# Patient Record
Sex: Female | Born: 1985 | Race: White | Hispanic: No | Marital: Married | Smoking: Current every day smoker
Health system: Southern US, Community
[De-identification: ages and names within clinical notes are randomized; demographics above are authoritative.]

## PROBLEM LIST (undated history)

## (undated) ENCOUNTER — Inpatient Hospital Stay (HOSPITAL_COMMUNITY): Payer: Self-pay

## (undated) DIAGNOSIS — N809 Endometriosis, unspecified: Secondary | ICD-10-CM

## (undated) DIAGNOSIS — A6000 Herpesviral infection of urogenital system, unspecified: Secondary | ICD-10-CM

## (undated) DIAGNOSIS — I10 Essential (primary) hypertension: Secondary | ICD-10-CM

## (undated) HISTORY — PX: ABDOMINAL HYSTERECTOMY: SHX81

## (undated) HISTORY — DX: Herpesviral infection of urogenital system, unspecified: A60.00

---

## 2007-05-29 HISTORY — PX: KNEE SURGERY: SHX244

## 2009-05-28 DIAGNOSIS — N809 Endometriosis, unspecified: Secondary | ICD-10-CM

## 2009-05-28 HISTORY — PX: OTHER SURGICAL HISTORY: SHX169

## 2009-05-28 HISTORY — DX: Endometriosis, unspecified: N80.9

## 2010-10-16 ENCOUNTER — Inpatient Hospital Stay (INDEPENDENT_AMBULATORY_CARE_PROVIDER_SITE_OTHER)
Admission: RE | Admit: 2010-10-16 | Discharge: 2010-10-16 | Disposition: A | Discharge status: Home / Self Care | Payer: Medicaid Other | Source: Ambulatory Visit | Attending: Family Medicine | Admitting: Family Medicine

## 2010-10-16 DIAGNOSIS — J029 Acute pharyngitis, unspecified: Secondary | ICD-10-CM

## 2013-01-02 ENCOUNTER — Inpatient Hospital Stay (HOSPITAL_COMMUNITY): Payer: Medicaid Other

## 2013-01-02 ENCOUNTER — Inpatient Hospital Stay (HOSPITAL_COMMUNITY)
Admission: AD | Admit: 2013-01-02 | Discharge: 2013-01-02 | Disposition: A | Discharge status: Home / Self Care | Payer: Medicaid Other | Source: Ambulatory Visit | Attending: Obstetrics and Gynecology | Admitting: Obstetrics and Gynecology

## 2013-01-02 ENCOUNTER — Encounter (HOSPITAL_COMMUNITY): Payer: Self-pay

## 2013-01-02 DIAGNOSIS — O209 Hemorrhage in early pregnancy, unspecified: Secondary | ICD-10-CM

## 2013-01-02 DIAGNOSIS — R109 Unspecified abdominal pain: Secondary | ICD-10-CM | POA: Insufficient documentation

## 2013-01-02 HISTORY — DX: Endometriosis, unspecified: N80.9

## 2013-01-02 LAB — CBC
MCH: 30.4 pg (ref 26.0–34.0)
MCHC: 34.2 g/dL (ref 30.0–36.0)
MCV: 89.1 fL (ref 78.0–100.0)
Platelets: 259 10*3/uL (ref 150–400)

## 2013-01-02 LAB — URINALYSIS, ROUTINE W REFLEX MICROSCOPIC
Ketones, ur: NEGATIVE mg/dL
Leukocytes, UA: NEGATIVE
Nitrite: NEGATIVE
Protein, ur: NEGATIVE mg/dL

## 2013-01-02 LAB — WET PREP, GENITAL: Trich, Wet Prep: NONE SEEN

## 2013-01-02 LAB — ABO/RH: ABO/RH(D): A POS

## 2013-01-02 LAB — POCT PREGNANCY, URINE: Preg Test, Ur: POSITIVE — AB

## 2013-01-02 NOTE — MAU Note (Signed)
Pt presents with complaints of vaginal spotting since lunchtime today and states that she is having abdominal cramping.

## 2013-01-02 NOTE — MAU Provider Note (Signed)
History     CSN: [id number]  Arrival date and time: 01/02/13 1542   First Provider Initiated Contact with Patient 01/02/13 1837      Chief Complaint  Patient presents with  . Abdominal Cramping   HPI  Pt is a G3P2002 at 4.0 wks IUP by LMP here with report of spotting of blood and pelvic pain x two days.  Pain is described as midpelvic and "crampy" in nature.  Patient's last menstrual period was 12/05/2012.  No report of abnormal vaginal discharge or UTI symptoms.     Past Medical History  Diagnosis Date  . Endometriosis 2011    Dx by lap    Past Surgical History  Procedure Laterality Date  . Knee surgery  2009  . Laproscopic exam  2011    No family history on file.  History  Substance Use Topics  . Smoking status: Not on file  . Smokeless tobacco: Not on file  . Alcohol Use: Not on file    Allergies:  Allergies  Allergen Reactions  . Cefzil (Cefprozil) Anaphylaxis and Swelling    Childhood reaction.  . Codeine Nausea And Vomiting    No prescriptions prior to admission    Review of Systems  Gastrointestinal: Positive for abdominal pain (cramping).  Genitourinary:       Spotting of blood  All other systems reviewed and are negative.   Physical Exam   Blood pressure 133/70, pulse 86, temperature 98.2 F (36.8 C), temperature source Oral, resp. rate 16, last menstrual period 12/05/2012.  Physical Exam  Constitutional: She is oriented to person, place, and time. She appears well-developed and well-nourished. No distress.  HENT:  Head: Normocephalic.  Neck: Normal range of motion. Neck supple.  Cardiovascular: Normal rate, regular rhythm and normal heart sounds.   Respiratory: Effort normal and breath sounds normal. No respiratory distress.  GI: Soft. She exhibits no mass. There is tenderness (midpelvic). There is no rebound and no guarding.  Genitourinary: Uterus is enlarged. Right adnexum displays no mass, no tenderness and no fullness. Left adnexum  displays no mass, no tenderness and no fullness. No bleeding around the vagina.  Musculoskeletal: Normal range of motion.  Neurological: She is alert and oriented to person, place, and time.  Skin: Skin is warm and dry.    MAU Course  Procedures  Results for orders placed during the hospital encounter of 01/02/13 (from the past 24 hour(s))  URINALYSIS, ROUTINE W REFLEX MICROSCOPIC     Status: None   Collection Time    01/02/13  4:35 PM      Result Value Range   Color, Urine YELLOW  YELLOW   APPearance CLEAR  CLEAR   Specific Gravity, Urine 1.010  1.005 - 1.030   pH 7.5  5.0 - 8.0   Glucose, UA NEGATIVE  NEGATIVE mg/dL   Hgb urine dipstick NEGATIVE  NEGATIVE   Bilirubin Urine NEGATIVE  NEGATIVE   Ketones, ur NEGATIVE  NEGATIVE mg/dL   Protein, ur NEGATIVE  NEGATIVE mg/dL   Urobilinogen, UA 0.2  0.0 - 1.0 mg/dL   Nitrite NEGATIVE  NEGATIVE   Leukocytes, UA NEGATIVE  NEGATIVE  POCT PREGNANCY, URINE     Status: Abnormal   Collection Time    01/02/13  4:50 PM      Result Value Range   Preg Test, Ur POSITIVE (*) NEGATIVE  HCG, QUANTITATIVE, PREGNANCY     Status: Abnormal   Collection Time    01/02/13  5:25 PM  Result Value Range   hCG, Beta Chain, Quant, S 97 (*) <5 mIU/mL  CBC     Status: None   Collection Time    01/02/13  5:25 PM      Result Value Range   WBC 5.2  4.0 - 10.5 K/uL   RBC 4.04  3.87 - 5.11 MIL/uL   Hemoglobin 12.3  12.0 - 15.0 g/dL   HCT 27.2  53.6 - 64.4 %   MCV 89.1  78.0 - 100.0 fL   MCH 30.4  26.0 - 34.0 pg   MCHC 34.2  30.0 - 36.0 g/dL   RDW 03.4  74.2 - 59.5 %   Platelets 259  150 - 400 K/uL  ABO/RH     Status: None   Collection Time    01/02/13  5:25 PM      Result Value Range   ABO/RH(D) A POS    WET PREP, GENITAL     Status: Abnormal   Collection Time    01/02/13  6:47 PM      Result Value Range   Yeast Wet Prep HPF POC NONE SEEN  NONE SEEN   Trich, Wet Prep NONE SEEN  NONE SEEN   Clue Cells Wet Prep HPF POC NONE SEEN  NONE SEEN    WBC, Wet Prep HPF POC FEW (*) NONE SEEN   Ultrasound: IMPRESSION:  No intrauterine gestational sac, yolk sac, fetal pole, or cardiac  activity visualized. Differential considerations include  intrauterine gestation too early to be sonographically visualized,  spontaneous abortion, or ectopic pregnancy. Consider follow-up  ultrasound in 14 days and serial quantitative beta HCG follow-up.   Assessment and Plan  Bleeding in Pregnancy  Plan: Discharge to home Repeat BHCG in 48 hrs Ectopic precautions  [facility] 01/02/2013, 6:41 PM

## 2013-01-04 ENCOUNTER — Encounter (HOSPITAL_COMMUNITY): Payer: Self-pay | Admitting: Obstetrics and Gynecology

## 2013-01-04 ENCOUNTER — Inpatient Hospital Stay (HOSPITAL_COMMUNITY)
Admission: AD | Admit: 2013-01-04 | Discharge: 2013-01-04 | Disposition: A | Discharge status: Home / Self Care | Payer: Medicaid Other | Source: Ambulatory Visit | Attending: Obstetrics and Gynecology | Admitting: Obstetrics and Gynecology

## 2013-01-04 DIAGNOSIS — O26859 Spotting complicating pregnancy, unspecified trimester: Secondary | ICD-10-CM | POA: Insufficient documentation

## 2013-01-04 DIAGNOSIS — Z3490 Encounter for supervision of normal pregnancy, unspecified, unspecified trimester: Secondary | ICD-10-CM

## 2013-01-04 DIAGNOSIS — O219 Vomiting of pregnancy, unspecified: Secondary | ICD-10-CM

## 2013-01-04 DIAGNOSIS — R109 Unspecified abdominal pain: Secondary | ICD-10-CM | POA: Insufficient documentation

## 2013-01-04 DIAGNOSIS — O209 Hemorrhage in early pregnancy, unspecified: Secondary | ICD-10-CM

## 2013-01-04 LAB — HCG, QUANTITATIVE, PREGNANCY: hCG, Beta Chain, Quant, S: 220 m[IU]/mL — ABNORMAL HIGH (ref <5)

## 2013-01-04 MED ORDER — PRENATAL PLUS 27-1 MG PO TABS: 1.0000 | ORAL_TABLET | Freq: Every day | ORAL | Status: DC

## 2013-01-04 NOTE — MAU Note (Signed)
Pt here for f/u BHCG. No pain or cramping or bleeding reported.

## 2013-01-04 NOTE — MAU Provider Note (Signed)
Chief Complaint  Patient presents with  . Follow-up    Subjective Lori Gentry 27 y.o.  [id number] at [redacted]w[redacted]d by LMP presented 01/02/2013 with first episode of light spotting and mild lower abdominal cramping 2 days ago. Had positive beta hCG 97. Blood type A positive. Ultrasound: no IUP or adnexal mass seen. No further abdominal cramping or spotting. Occasional nausea with no vomiting. Denies abnormal vaginal discharge or irritation. No dysuria or hematuria.    Problem list, past medical history, Ob/Gyn history, surgical history, family history and social history reviewed and updated as appropriate. Pertinent Medical History:none Pertinent Ob/Gyn History: NSVD x 2 Pertinent Surgical History:none No prescriptions prior to admission    Allergies  Allergen Reactions  . Cefzil (Cefprozil) Anaphylaxis and Swelling    Childhood reaction.  . Codeine Nausea And Vomiting     Objective   Filed Vitals:   01/04/13 1412  BP: 117/70  Pulse: 84  Temp: 98.4 F (36.9 C)  Resp: 18     Physical Exam General: WN/WD in NAD  Abdom: soft, NT Lab Results Results for orders placed during the hospital encounter of 01/04/13 (from the past 24 hour(s))  HCG, QUANTITATIVE, PREGNANCY     Status: Abnormal   Collection Time    01/04/13  1:53 PM      Result Value Range   hCG, Beta Chain, Quant, S 220 (*) <5 mIU/mL    Ultrasound  US Ob Comp Less 14 Wks  01/02/2013   *RADIOLOGY REPORT*  Clinical Data: First trimester pregnancy with cramping and spotting.  LMP 12/05/2012.  OBSTETRIC <14 WK [place] AND TRANSVAGINAL OB US  Technique:  Both transabdominal and transvaginal ultrasound examinations were performed for complete evaluation of the gestation as well as the maternal uterus, adnexal regions, and pelvic cul-de-sac.  Transvaginal technique was performed to assess early pregnancy.  Comparison:  None.  Intrauterine gestational sac:  None visualized.  Maternal uterus/adnexae: The uterus appears normal.  There is  no subchorionic hematoma. Both ovaries appear normal.  There is no adnexal mass.  Trace free pelvic fluid is within physiologic limits.  IMPRESSION: No intrauterine gestational sac, yolk sac, fetal pole, or cardiac activity visualized. Differential considerations include intrauterine gestation too early to be sonographically visualized, spontaneous abortion, or ectopic pregnancy.  Consider follow-up ultrasound in 14 days and serial quantitative beta HCG follow-up.   Original Report Authenticated By: Carey Bullocks, M.D.   US Ob Transvaginal  01/02/2013   *RADIOLOGY REPORT*  Clinical Data: First trimester pregnancy with cramping and spotting.  LMP 12/05/2012.  OBSTETRIC <14 WK [place] AND TRANSVAGINAL OB US  Technique:  Both transabdominal and transvaginal ultrasound examinations were performed for complete evaluation of the gestation as well as the maternal uterus, adnexal regions, and pelvic cul-de-sac.  Transvaginal technique was performed to assess early pregnancy.  Comparison:  None.  Intrauterine gestational sac:  None visualized.  Maternal uterus/adnexae: The uterus appears normal.  There is no subchorionic hematoma. Both ovaries appear normal.  There is no adnexal mass.  Trace free pelvic fluid is within physiologic limits.  IMPRESSION: No intrauterine gestational sac, yolk sac, fetal pole, or cardiac activity visualized. Differential considerations include intrauterine gestation too early to be sonographically visualized, spontaneous abortion, or ectopic pregnancy.  Consider follow-up ultrasound in 14 days and serial quantitative beta HCG follow-up.   Original Report Authenticated By: Carey Bullocks, M.D.    Assessment 1. Pregnancy   G3P2002 [redacted]w[redacted]d with appropriate rise in quant beta hCG   Plan  Discharge home See AVS for pt education   Medication List         prenatal vitamin w/FE, FA 27-1 MG Tabs tablet  Take 1 tablet by mouth daily.      list of providers, and eligibility letter   given  Follow-up Information   Follow up with [facility] [place]. (See list of providers below. Someone from ultrasound department will call you with an appointment next week)    Contact information:   [address] [place] [address] [phone]      [name] 01/04/2013 4:04 PM

## 2013-01-04 NOTE — MAU Provider Note (Signed)
Attestation of Attending Supervision of Advanced Practitioner: Evaluation and management procedures were performed by the PA/NP/CNM/OB Fellow under my supervision/collaboration. Chart reviewed and agree with management and plan.  Hadeel Hillebrand V 01/04/2013 9:19 PM

## 2013-01-05 NOTE — MAU Provider Note (Signed)
Attestation of Attending Supervision of Advanced Practitioner (CNM/NP): Evaluation and management procedures were performed by the Advanced Practitioner under my supervision and collaboration.  I have reviewed the Advanced Practitioner's note and chart, and I agree with the management and plan.  Rommie Dunn 01/05/2013 11:19 AM

## 2013-01-16 ENCOUNTER — Inpatient Hospital Stay (HOSPITAL_COMMUNITY)
Admission: AD | Admit: 2013-01-16 | Discharge: 2013-01-16 | Disposition: A | Discharge status: Home / Self Care | Payer: Medicaid Other | Source: Ambulatory Visit | Attending: Obstetrics & Gynecology | Admitting: Obstetrics & Gynecology

## 2013-01-16 ENCOUNTER — Ambulatory Visit (HOSPITAL_COMMUNITY)
Admission: RE | Admit: 2013-01-16 | Discharge: 2013-01-16 | Disposition: A | Discharge status: Home / Self Care | Payer: Medicaid Other | Source: Ambulatory Visit | Attending: Obstetrics and Gynecology | Admitting: Obstetrics and Gynecology

## 2013-01-16 DIAGNOSIS — Z3689 Encounter for other specified antenatal screening: Secondary | ICD-10-CM | POA: Insufficient documentation

## 2013-01-16 DIAGNOSIS — O0930 Supervision of pregnancy with insufficient antenatal care, unspecified trimester: Secondary | ICD-10-CM | POA: Insufficient documentation

## 2013-01-16 DIAGNOSIS — O99891 Other specified diseases and conditions complicating pregnancy: Secondary | ICD-10-CM | POA: Insufficient documentation

## 2013-01-16 DIAGNOSIS — O209 Hemorrhage in early pregnancy, unspecified: Secondary | ICD-10-CM

## 2013-01-16 NOTE — MAU Note (Signed)
Pt states here for F/U only, denies pain or bleeding

## 2013-09-21 IMAGING — US US OB COMP LESS 14 WK
1 series · 14 of 28 positions shown · non-contrast
Comparison: None.

CLINICAL DATA: First trimester pregnancy with cramping and
spotting.  LMP 12/05/2012.

OBSTETRIC <14 WK US AND TRANSVAGINAL OB US
TECHNIQUE: Both transabdominal and transvaginal ultrasound
examinations were performed for complete evaluation of the
gestation as well as the maternal uterus, adnexal regions, and
pelvic cul-de-sac.  Transvaginal technique was performed to assess
early pregnancy.

[Series 1: us ob comp less 14 wks · 14 of 48 slices shown]
[im 2/48]
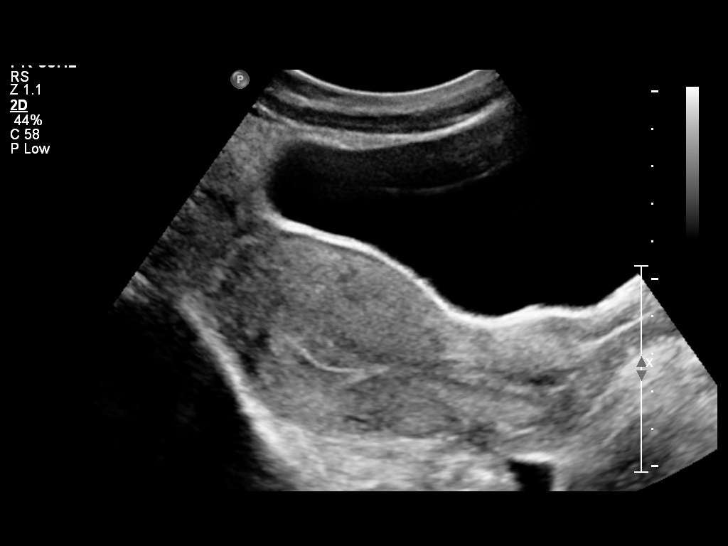
[im 6/48]
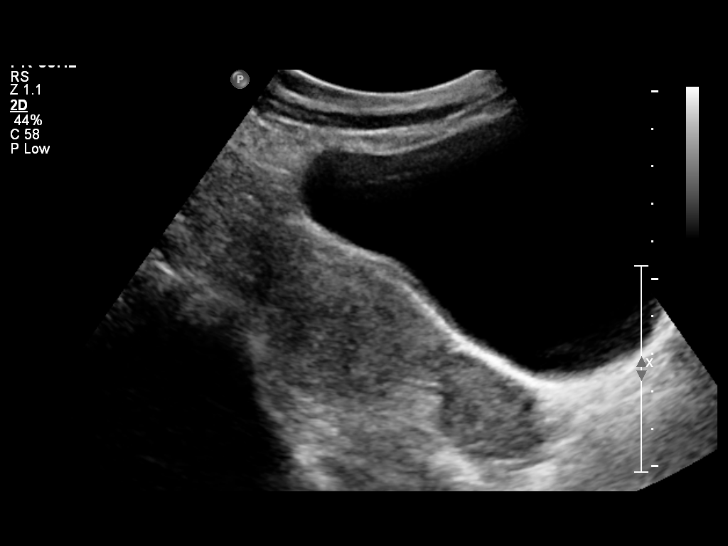
[im 9/48]
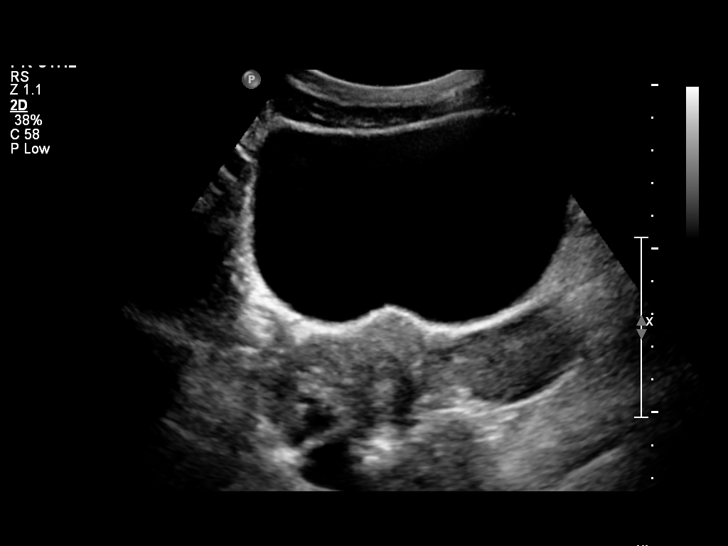
[im 13/48]
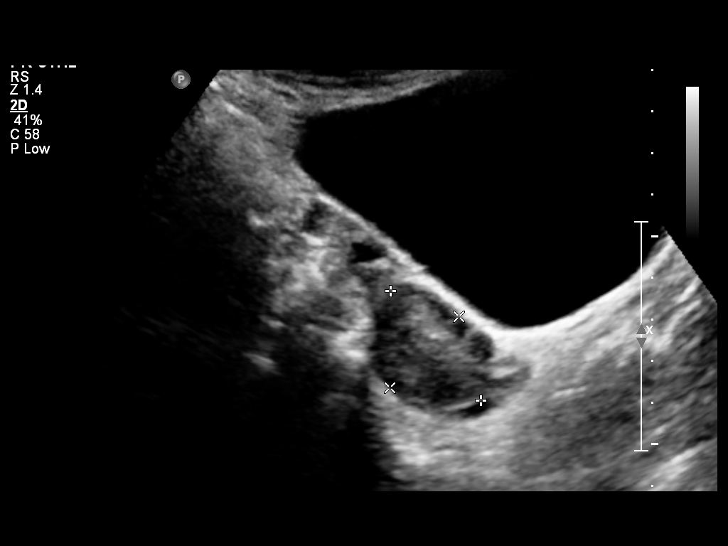
[im 16/48]
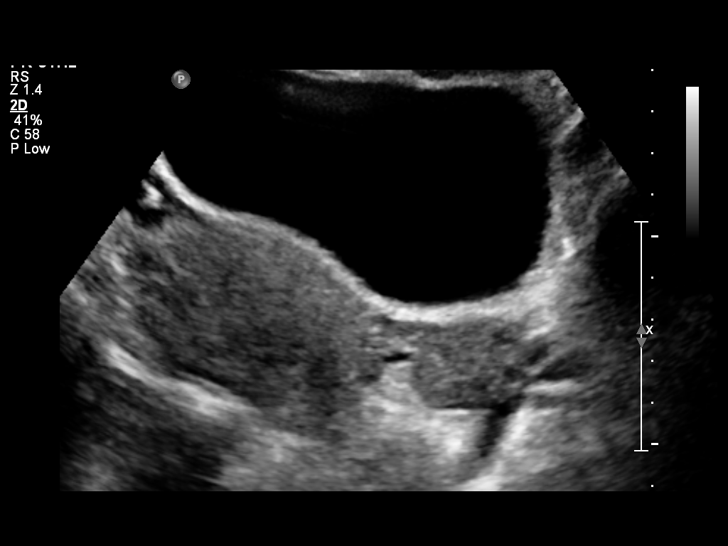
[im 20/48]
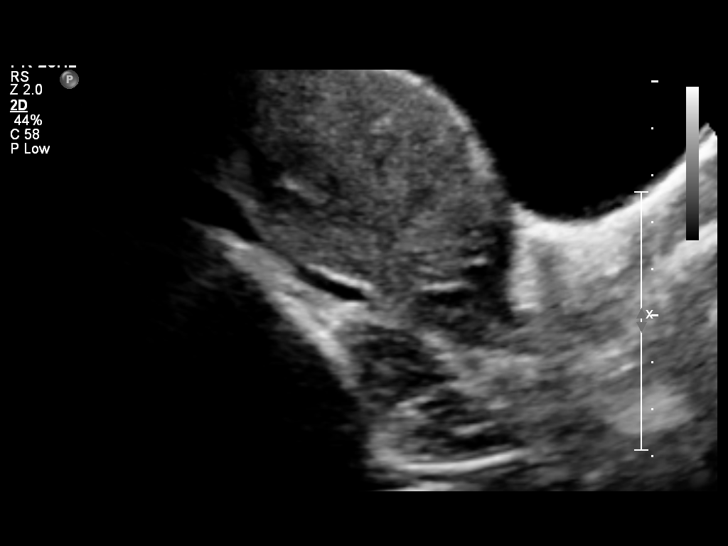
[im 23/48]
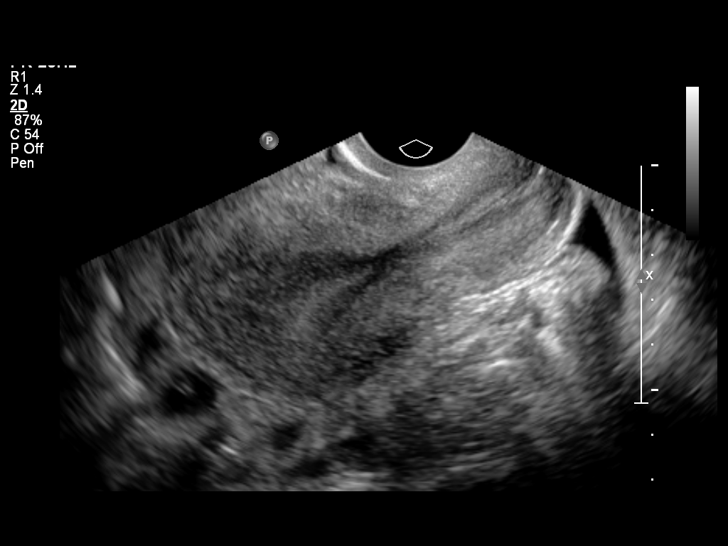
[im 27/48]
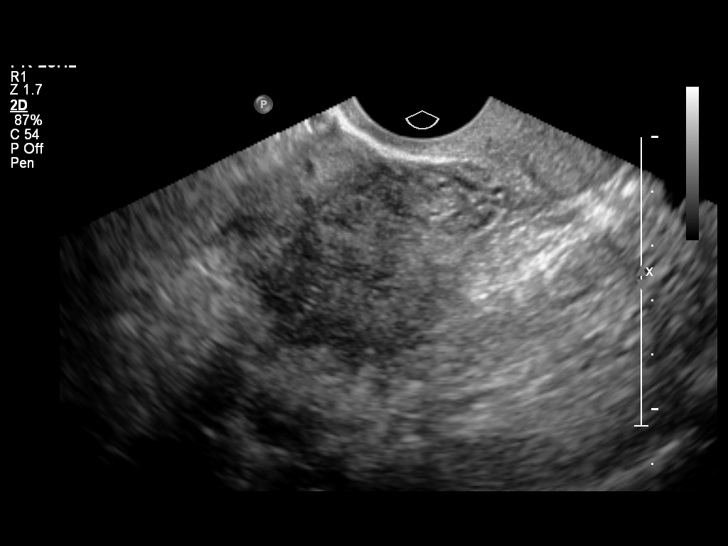
[im 30/48]
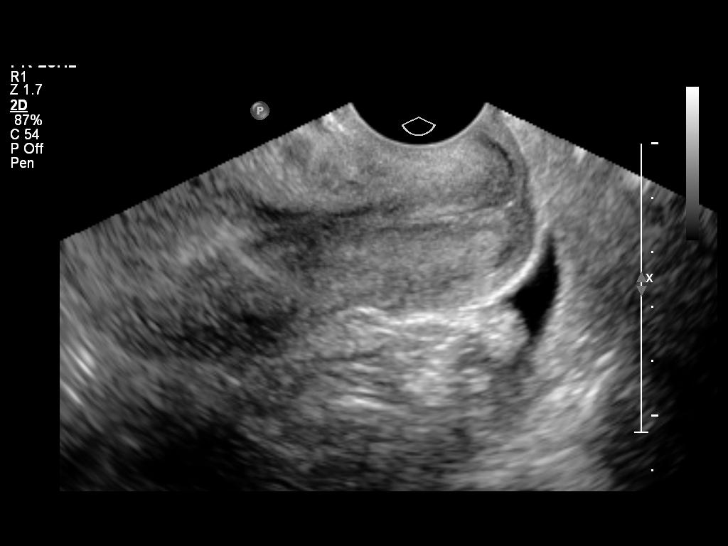
[im 34/48]
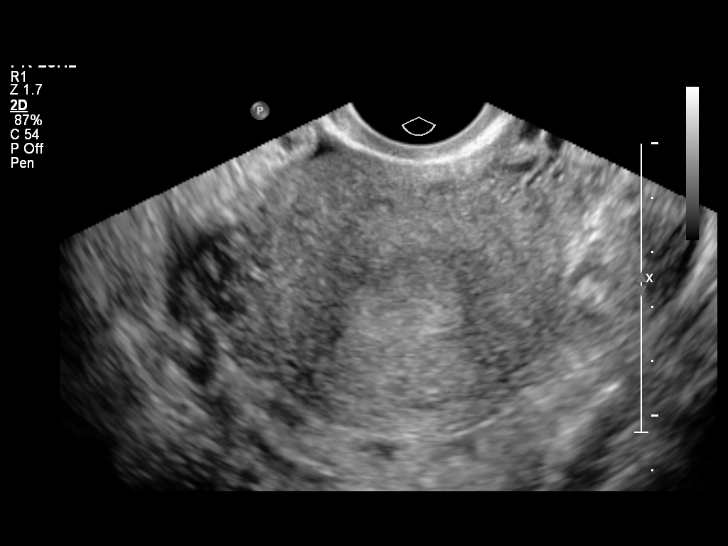
[im 37/48]
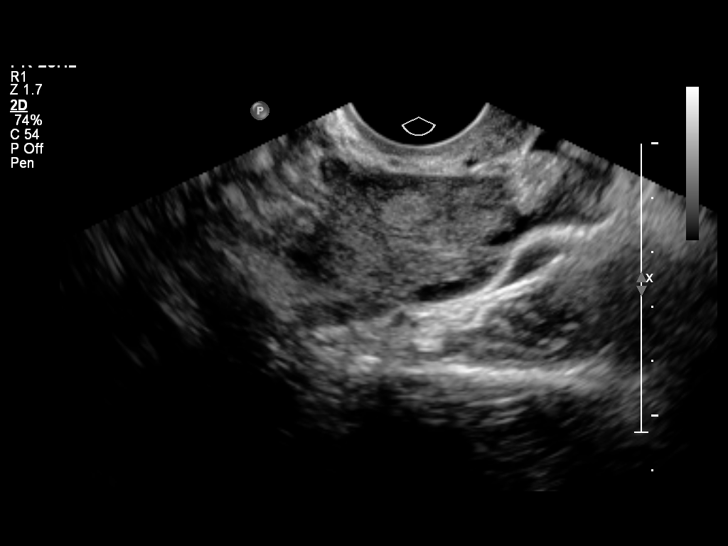
[im 41/48]
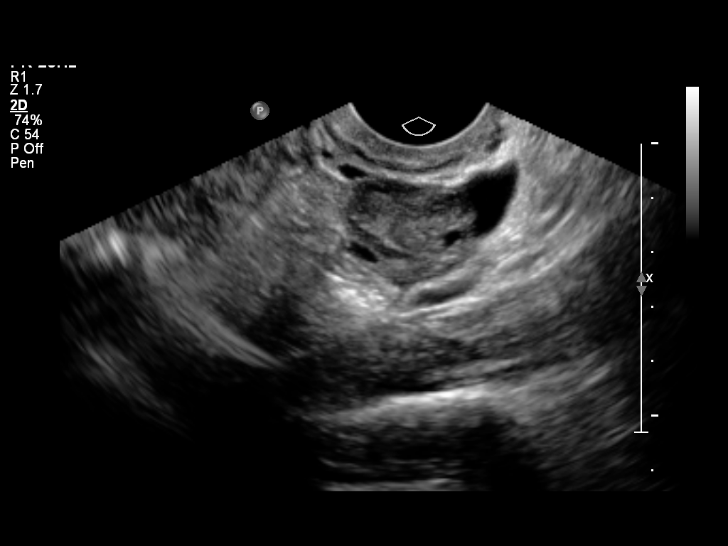
[im 44/48]
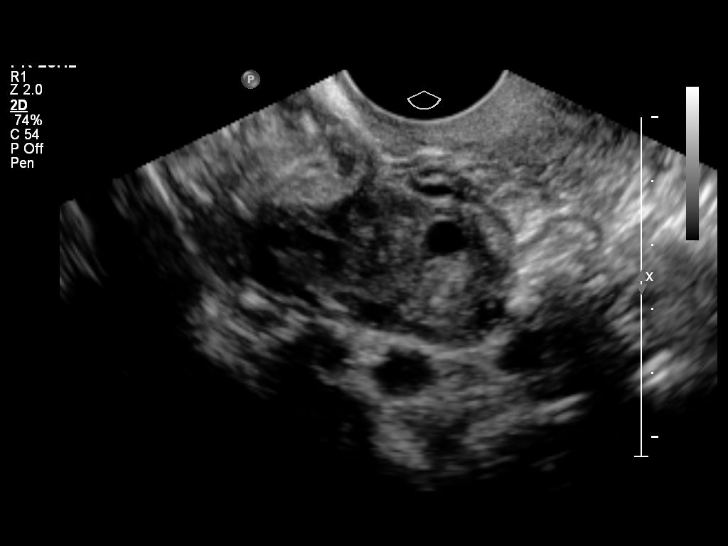
[im 48/48]
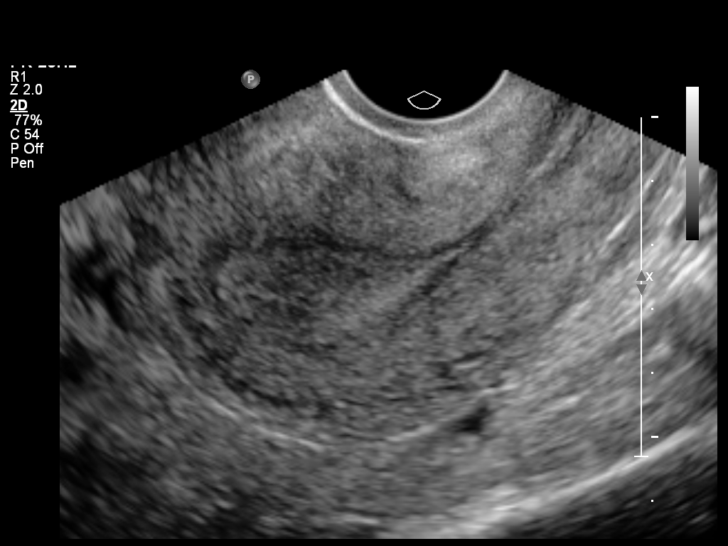

[14 of 28 positions shown; findings below may reference images not displayed]

Intrauterine gestational sac:  None visualized.

Maternal uterus/adnexae:
The uterus appears normal.  There is no subchorionic hematoma.
Both ovaries appear normal.  There is no adnexal mass.  Trace free
pelvic fluid is within physiologic limits.
IMPRESSION: No intrauterine gestational sac, yolk sac, fetal pole, or cardiac
activity visualized. Differential considerations include
intrauterine gestation too early to be sonographically visualized,
spontaneous abortion, or ectopic pregnancy.  Consider follow-up
ultrasound in 14 days and serial quantitative beta HCG follow-up.

## 2013-11-07 ENCOUNTER — Encounter (HOSPITAL_COMMUNITY): Payer: Self-pay | Admitting: *Deleted

## 2014-03-29 ENCOUNTER — Encounter (HOSPITAL_COMMUNITY): Payer: Self-pay | Admitting: *Deleted

## 2014-05-28 NOTE — L&D Delivery Note (Signed)
    Delivery Note At 3:38 AM a viable and healthy female was delivered via Vaginal, Spontaneous Delivery (Presentation: Right Occiput Anterior). Delivered partial in caul with light meconium stained amniotic fluid. Cord wrapped around shoulder, easily reduced.  APGAR:8 , 9; weight pending  .   Placenta status: Intact, Spontaneous.  Cord: 3 vessels with the following complications: None.  Cord pH: N/A  Anesthesia: Epidural  Episiotomy: None Lacerations:  none Suture Repair: none Est. Blood Loss (mL):  212  Mom to postpartum.  Baby to Couplet care / Skin to Skin.  De Hollingshead 01/09/2015, 3:54 AM

## 2014-08-11 LAB — OB RESULTS CONSOLE ANTIBODY SCREEN: ANTIBODY SCREEN: NEGATIVE

## 2014-08-11 LAB — OB RESULTS CONSOLE PLATELET COUNT: Platelets: 286 10*3/uL

## 2014-08-11 LAB — OB RESULTS CONSOLE HGB/HCT, BLOOD
HCT: 36 %
HEMOGLOBIN: 11.4 g/dL

## 2014-08-11 LAB — OB RESULTS CONSOLE HEPATITIS B SURFACE ANTIGEN: HEP B S AG: NEGATIVE

## 2014-08-11 LAB — OB RESULTS CONSOLE ABO/RH: RH Type: POSITIVE

## 2014-08-11 LAB — OB RESULTS CONSOLE RPR: RPR: NONREACTIVE

## 2014-08-11 LAB — OB RESULTS CONSOLE HIV ANTIBODY (ROUTINE TESTING): HIV: NONREACTIVE

## 2014-08-11 LAB — OB RESULTS CONSOLE TSH: TSH: 1.51

## 2014-08-11 LAB — OB RESULTS CONSOLE RUBELLA ANTIBODY, IGM: RUBELLA: IMMUNE

## 2014-08-11 LAB — CYSTIC FIBROSIS DIAGNOSTIC STUDY: Interpretation-CFDNA:: NEGATIVE

## 2014-09-29 LAB — GLUCOSE TOLERANCE, 1 HOUR (50G) W/O FASTING: GLUCOSE 1 HOUR GTT: 97 mg/dL (ref ?–200)

## 2014-11-30 ENCOUNTER — Encounter: Payer: Self-pay | Admitting: *Deleted

## 2014-12-08 ENCOUNTER — Encounter: Payer: Self-pay | Admitting: Advanced Practice Midwife

## 2014-12-08 ENCOUNTER — Encounter (HOSPITAL_COMMUNITY): Payer: Self-pay | Admitting: *Deleted

## 2014-12-08 ENCOUNTER — Inpatient Hospital Stay (HOSPITAL_COMMUNITY)
Admission: AD | Admit: 2014-12-08 | Discharge: 2014-12-09 | Disposition: A | Discharge status: Home / Self Care | Payer: Medicaid Other | Source: Ambulatory Visit | Attending: Obstetrics and Gynecology | Admitting: Obstetrics and Gynecology

## 2014-12-08 ENCOUNTER — Other Ambulatory Visit (HOSPITAL_COMMUNITY)
Admission: RE | Admit: 2014-12-08 | Discharge: 2014-12-08 | Disposition: A | Discharge status: Home / Self Care | Payer: Medicaid Other | Source: Ambulatory Visit | Attending: Advanced Practice Midwife | Admitting: Advanced Practice Midwife

## 2014-12-08 ENCOUNTER — Ambulatory Visit (INDEPENDENT_AMBULATORY_CARE_PROVIDER_SITE_OTHER): Payer: Medicaid Other | Admitting: Advanced Practice Midwife

## 2014-12-08 VITALS — BP 132/82 | HR 102 | Temp 97.8°F | Wt 178.9 lb

## 2014-12-08 DIAGNOSIS — B009 Herpesviral infection, unspecified: Secondary | ICD-10-CM

## 2014-12-08 DIAGNOSIS — O9989 Other specified diseases and conditions complicating pregnancy, childbirth and the puerperium: Secondary | ICD-10-CM | POA: Insufficient documentation

## 2014-12-08 DIAGNOSIS — Z124 Encounter for screening for malignant neoplasm of cervix: Secondary | ICD-10-CM | POA: Diagnosis not present

## 2014-12-08 DIAGNOSIS — Z113 Encounter for screening for infections with a predominantly sexual mode of transmission: Secondary | ICD-10-CM

## 2014-12-08 DIAGNOSIS — Z23 Encounter for immunization: Secondary | ICD-10-CM | POA: Diagnosis not present

## 2014-12-08 DIAGNOSIS — Z3A35 35 weeks gestation of pregnancy: Secondary | ICD-10-CM | POA: Insufficient documentation

## 2014-12-08 DIAGNOSIS — O98513 Other viral diseases complicating pregnancy, third trimester: Secondary | ICD-10-CM

## 2014-12-08 DIAGNOSIS — Z3493 Encounter for supervision of normal pregnancy, unspecified, third trimester: Secondary | ICD-10-CM | POA: Insufficient documentation

## 2014-12-08 DIAGNOSIS — Z3483 Encounter for supervision of other normal pregnancy, third trimester: Secondary | ICD-10-CM

## 2014-12-08 DIAGNOSIS — O98519 Other viral diseases complicating pregnancy, unspecified trimester: Secondary | ICD-10-CM

## 2014-12-08 DIAGNOSIS — Z01419 Encounter for gynecological examination (general) (routine) without abnormal findings: Secondary | ICD-10-CM | POA: Insufficient documentation

## 2014-12-08 DIAGNOSIS — Z118 Encounter for screening for other infectious and parasitic diseases: Secondary | ICD-10-CM | POA: Diagnosis not present

## 2014-12-08 DIAGNOSIS — O99333 Smoking (tobacco) complicating pregnancy, third trimester: Secondary | ICD-10-CM | POA: Insufficient documentation

## 2014-12-08 DIAGNOSIS — F17210 Nicotine dependence, cigarettes, uncomplicated: Secondary | ICD-10-CM | POA: Insufficient documentation

## 2014-12-08 LAB — POCT URINALYSIS DIP (DEVICE)
Glucose, UA: NEGATIVE mg/dL
HGB URINE DIPSTICK: NEGATIVE
Ketones, ur: NEGATIVE mg/dL
Leukocytes, UA: NEGATIVE
NITRITE: NEGATIVE
PROTEIN: 30 mg/dL — AB
Specific Gravity, Urine: 1.025 (ref 1.005–1.030)
Urobilinogen, UA: 1 mg/dL (ref 0.0–1.0)
pH: 7 (ref 5.0–8.0)

## 2014-12-08 LAB — OB RESULTS CONSOLE GC/CHLAMYDIA
Chlamydia: NEGATIVE
GC PROBE AMP, GENITAL: NEGATIVE

## 2014-12-08 LAB — OB RESULTS CONSOLE GBS: GBS: NEGATIVE

## 2014-12-08 MED ORDER — ACYCLOVIR 200 MG PO CAPS: 400.0000 mg | ORAL_CAPSULE | Freq: Two times a day (BID) | ORAL | Status: DC

## 2014-12-08 MED ORDER — TETANUS-DIPHTH-ACELL PERTUSSIS 5-2.5-18.5 LF-MCG/0.5 IM SUSP
0.5000 mL | Freq: Once | INTRAMUSCULAR | Status: AC
Start: 2014-12-08 — End: 2014-12-08
  Administered 2014-12-08: 0.5 mL via INTRAMUSCULAR

## 2014-12-08 NOTE — Patient Instructions (Addendum)
Braxton Hicks Contractions Contractions of the uterus can occur throughout pregnancy. Contractions are not always a sign that you are in labor.  WHAT ARE BRAXTON HICKS CONTRACTIONS?  Contractions that occur before labor are called Braxton Hicks contractions, or false labor. Toward the end of pregnancy (32-34 weeks), these contractions can develop more often and may become more forceful. This is not true labor because these contractions do not result in opening (dilatation) and thinning of the cervix. They are sometimes difficult to tell apart from true labor because these contractions can be forceful and people have different pain tolerances. You should not feel embarrassed if you go to the hospital with false labor. Sometimes, the only way to tell if you are in true labor is for your health care provider to look for changes in the cervix. If there are no prenatal problems or other health problems associated with the pregnancy, it is completely safe to be sent home with false labor and await the onset of true labor. HOW CAN YOU TELL THE DIFFERENCE BETWEEN TRUE AND FALSE LABOR? False Labor  The contractions of false labor are usually shorter and not as hard as those of true labor.   The contractions are usually irregular.   The contractions are often felt in the front of the lower abdomen and in the groin.   The contractions may go away when you walk around or change positions while lying down.   The contractions get weaker and are shorter lasting as time goes on.   The contractions do not usually become progressively stronger, regular, and closer together as with true labor.  True Labor  Contractions in true labor last 30-70 seconds, become very regular, usually become more intense, and increase in frequency.   The contractions do not go away with walking.   The discomfort is usually felt in the top of the uterus and spreads to the lower abdomen and low back.   True labor can be  determined by your health care provider with an exam. This will show that the cervix is dilating and getting thinner.  WHAT TO REMEMBER  Keep up with your usual exercises and follow other instructions given by your health care provider.   Take medicines as directed by your health care provider.   Keep your regular prenatal appointments.   Eat and drink lightly if you think you are going into labor.   If Braxton Hicks contractions are making you uncomfortable:   Change your position from lying down or resting to walking, or from walking to resting.   Sit and rest in a tub of warm water.   Drink 2-3 glasses of water. Dehydration may cause these contractions.   Do slow and deep breathing several times an hour.  WHEN SHOULD I SEEK IMMEDIATE MEDICAL CARE? Seek immediate medical care if:  Your contractions become stronger, more regular, and closer together.   You have fluid leaking or gushing from your vagina.   You have a fever.   You pass blood-tinged mucus.   You have vaginal bleeding.   You have continuous abdominal pain.   You have low back pain that you never had before.   You feel your baby's head pushing down and causing pelvic pressure.   Your baby is not moving as much as it used to.  Document Released: 05/14/2005 Document Revised: 05/19/2013 Document Reviewed: 02/23/2013 [employer] Patient Information 2015 [employer]. This information is not intended to replace advice given to you by your health care   provider. Make sure you discuss any questions you have with your health care provider.  Breastfeeding Deciding to breastfeed is one of the best choices you can make for you and your baby. A change in hormones during pregnancy causes your breast tissue to grow and increases the number and size of your milk ducts. These hormones also allow proteins, sugars, and fats from your blood supply to make breast milk in your milk-producing glands. Hormones  prevent breast milk from being released before your baby is born as well as prompt milk flow after birth. Once breastfeeding has begun, thoughts of your baby, as well as his or her sucking or crying, can stimulate the release of milk from your milk-producing glands.  BENEFITS OF BREASTFEEDING For Your Baby  Your first milk (colostrum) helps your baby's digestive system function better.   There are antibodies in your milk that help your baby fight off infections.   Your baby has a lower incidence of asthma, allergies, and sudden infant death syndrome.   The nutrients in breast milk are better for your baby than infant formulas and are designed uniquely for your baby's needs.   Breast milk improves your baby's brain development.   Your baby is less likely to develop other conditions, such as childhood obesity, asthma, or type 2 diabetes mellitus.  For You   Breastfeeding helps to create a very special bond between you and your baby.   Breastfeeding is convenient. Breast milk is always available at the correct temperature and costs nothing.   Breastfeeding helps to burn calories and helps you lose the weight gained during pregnancy.   Breastfeeding makes your uterus contract to its prepregnancy size faster and slows bleeding (lochia) after you give birth.   Breastfeeding helps to lower your risk of developing type 2 diabetes mellitus, osteoporosis, and breast or ovarian cancer later in life. SIGNS THAT YOUR BABY IS HUNGRY Early Signs of Hunger  Increased alertness or activity.  Stretching.  Movement of the head from side to side.  Movement of the head and opening of the mouth when the corner of the mouth or cheek is stroked (rooting).  Increased sucking sounds, smacking lips, cooing, sighing, or squeaking.  Hand-to-mouth movements.  Increased sucking of fingers or hands. Late Signs of Hunger  Fussing.  Intermittent crying. Extreme Signs of Hunger Signs of  extreme hunger will require calming and consoling before your baby will be able to breastfeed successfully. Do not wait for the following signs of extreme hunger to occur before you initiate breastfeeding:   Restlessness.  A loud, strong cry.   Screaming. BREASTFEEDING BASICS Breastfeeding Initiation  Find a comfortable place to sit or lie down, with your neck and back well supported.  Place a pillow or rolled up blanket under your baby to bring him or her to the level of your breast (if you are seated). Nursing pillows are specially designed to help support your arms and your baby while you breastfeed.  Make sure that your baby's abdomen is facing your abdomen.   Gently massage your breast. With your fingertips, massage from your chest wall toward your nipple in a circular motion. This encourages milk flow. You may need to continue this action during the feeding if your milk flows slowly.  Support your breast with 4 fingers underneath and your thumb above your nipple. Make sure your fingers are well away from your nipple and your baby's mouth.   Stroke your baby's lips gently with your finger or nipple.     When your baby's mouth is open wide enough, quickly bring your baby to your breast, placing your entire nipple and as much of the colored area around your nipple (areola) as possible into your baby's mouth.   More areola should be visible above your baby's upper lip than below the lower lip.   Your baby's tongue should be between his or her lower gum and your breast.   Ensure that your baby's mouth is correctly positioned around your nipple (latched). Your baby's lips should create a seal on your breast and be turned out (everted).  It is common for your baby to suck about 2-3 minutes in order to start the flow of breast milk. Latching Teaching your baby how to latch on to your breast properly is very important. An improper latch can cause nipple pain and decreased milk  supply for you and poor weight gain in your baby. Also, if your baby is not latched onto your nipple properly, he or she may swallow some air during feeding. This can make your baby fussy. Burping your baby when you switch breasts during the feeding can help to get rid of the air. However, teaching your baby to latch on properly is still the best way to prevent fussiness from swallowing air while breastfeeding. Signs that your baby has successfully latched on to your nipple:    Silent tugging or silent sucking, without causing you pain.   Swallowing heard between every 3-4 sucks.    Muscle movement above and in front of his or her ears while sucking.  Signs that your baby has not successfully latched on to nipple:   Sucking sounds or smacking sounds from your baby while breastfeeding.  Nipple pain. If you think your baby has not latched on correctly, slip your finger into the corner of your baby's mouth to break the suction and place it between your baby's gums. Attempt breastfeeding initiation again. Signs of Successful Breastfeeding Signs from your baby:   A gradual decrease in the number of sucks or complete cessation of sucking.   Falling asleep.   Relaxation of his or her body.   Retention of a small amount of milk in his or her mouth.   Letting go of your breast by himself or herself. Signs from you:  Breasts that have increased in firmness, weight, and size 1-3 hours after feeding.   Breasts that are softer immediately after breastfeeding.  Increased milk volume, as well as a change in milk consistency and color by the fifth day of breastfeeding.   Nipples that are not sore, cracked, or bleeding. Signs That Your Baby is Getting Enough Milk  Wetting at least 3 diapers in a 24-hour period. The urine should be clear and pale yellow by age 5 days.  At least 3 stools in a 24-hour period by age 5 days. The stool should be soft and yellow.  At least 3 stools in a  24-hour period by age 7 days. The stool should be seedy and yellow.  No loss of weight greater than 10% of birth weight during the first 3 days of age.  Average weight gain of 4-7 ounces (113-198 g) per week after age 4 days.  Consistent daily weight gain by age 5 days, without weight loss after the age of 2 weeks. After a feeding, your baby may spit up a small amount. This is common. BREASTFEEDING FREQUENCY AND DURATION Frequent feeding will help you make more milk and can prevent sore nipples and breast   engorgement. Breastfeed when you feel the need to reduce the fullness of your breasts or when your baby shows signs of hunger. This is called "breastfeeding on demand." Avoid introducing a pacifier to your baby while you are working to establish breastfeeding (the first 4-6 weeks after your baby is born). After this time you may choose to use a pacifier. Research has shown that pacifier use during the first year of a baby's life decreases the risk of sudden infant death syndrome (SIDS). Allow your baby to feed on each breast as long as he or she wants. Breastfeed until your baby is finished feeding. When your baby unlatches or falls asleep while feeding from the first breast, offer the second breast. Because newborns are often sleepy in the first few weeks of life, you may need to awaken your baby to get him or her to feed. Breastfeeding times will vary from baby to baby. However, the following rules can serve as a guide to help you ensure that your baby is properly fed:  Newborns (babies 4 weeks of age or younger) may breastfeed every 1-3 hours.  Newborns should not go longer than 3 hours during the day or 5 hours during the night without breastfeeding.  You should breastfeed your baby a minimum of 8 times in a 24-hour period until you begin to introduce solid foods to your baby at around 6 months of age. BREAST MILK PUMPING Pumping and storing breast milk allows you to ensure that your baby is  exclusively fed your breast milk, even at times when you are unable to breastfeed. This is especially important if you are going back to work while you are still breastfeeding or when you are not able to be present during feedings. Your lactation consultant can give you guidelines on how long it is safe to store breast milk.  A breast pump is a machine that allows you to pump milk from your breast into a sterile bottle. The pumped breast milk can then be stored in a refrigerator or freezer. Some breast pumps are operated by hand, while others use electricity. Ask your lactation consultant which type will work best for you. Breast pumps can be purchased, but some hospitals and breastfeeding support groups lease breast pumps on a monthly basis. A lactation consultant can teach you how to hand express breast milk, if you prefer not to use a pump.  CARING FOR YOUR BREASTS WHILE YOU BREASTFEED Nipples can become dry, cracked, and sore while breastfeeding. The following recommendations can help keep your breasts moisturized and healthy:  Avoid using soap on your nipples.   Wear a supportive bra. Although not required, special nursing bras and tank tops are designed to allow access to your breasts for breastfeeding without taking off your entire bra or top. Avoid wearing underwire-style bras or extremely tight bras.  Air dry your nipples for 3-4minutes after each feeding.   Use only cotton bra pads to absorb leaked breast milk. Leaking of breast milk between feedings is normal.   Use lanolin on your nipples after breastfeeding. Lanolin helps to maintain your skin's normal moisture barrier. If you use pure lanolin, you do not need to wash it off before feeding your baby again. Pure lanolin is not toxic to your baby. You may also hand express a few drops of breast milk and gently massage that milk into your nipples and allow the milk to air dry. In the first few weeks after giving birth, some women  experience extremely full   breasts (engorgement). Engorgement can make your breasts feel heavy, warm, and tender to the touch. Engorgement peaks within 3-5 days after you give birth. The following recommendations can help ease engorgement:  Completely empty your breasts while breastfeeding or pumping. You may want to start by applying warm, moist heat (in the shower or with warm water-soaked hand towels) just before feeding or pumping. This increases circulation and helps the milk flow. If your baby does not completely empty your breasts while breastfeeding, pump any extra milk after he or she is finished.  Wear a snug bra (nursing or regular) or tank top for 1-2 days to signal your body to slightly decrease milk production.  Apply ice packs to your breasts, unless this is too uncomfortable for you.  Make sure that your baby is latched on and positioned properly while breastfeeding. If engorgement persists after 48 hours of following these recommendations, contact your health care provider or a lactation consultant. OVERALL HEALTH CARE RECOMMENDATIONS WHILE BREASTFEEDING  Eat healthy foods. Alternate between meals and snacks, eating 3 of each per day. Because what you eat affects your breast milk, some of the foods may make your baby more irritable than usual. Avoid eating these foods if you are sure that they are negatively affecting your baby.  Drink milk, fruit juice, and water to satisfy your thirst (about 10 glasses a day).   Rest often, relax, and continue to take your prenatal vitamins to prevent fatigue, stress, and anemia.  Continue breast self-awareness checks.  Avoid chewing and smoking tobacco.  Avoid alcohol and drug use. Some medicines that may be harmful to your baby can pass through breast milk. It is important to ask your health care provider before taking any medicine, including all over-the-counter and prescription medicine as well as vitamin and herbal supplements. It is  possible to become pregnant while breastfeeding. If birth control is desired, ask your health care provider about options that will be safe for your baby. SEEK MEDICAL CARE IF:   You feel like you want to stop breastfeeding or have become frustrated with breastfeeding.  You have painful breasts or nipples.  Your nipples are cracked or bleeding.  Your breasts are red, tender, or warm.  You have a swollen area on either breast.  You have a fever or chills.  You have nausea or vomiting.  You have drainage other than breast milk from your nipples.  Your breasts do not become full before feedings by the fifth day after you give birth.  You feel sad and depressed.  Your baby is too sleepy to eat well.  Your baby is having trouble sleeping.   Your baby is wetting less than 3 diapers in a 24-hour period.  Your baby has less than 3 stools in a 24-hour period.  Your baby's skin or the white part of his or her eyes becomes yellow.   Your baby is not gaining weight by 5 days of age. SEEK IMMEDIATE MEDICAL CARE IF:   Your baby is overly tired (lethargic) and does not want to wake up and feed.  Your baby develops an unexplained fever. Document Released: 05/14/2005 Document Revised: 05/19/2013 Document Reviewed: 11/05/2012 [employer] Patient Information 2015 [employer]. This information is not intended to replace advice given to you by your health care provider. Make sure you discuss any questions you have with your health care provider.  

## 2014-12-08 NOTE — MAU Provider Note (Signed)
History     CSN: [id number]  Arrival date and time: 12/08/14 2247   None     No chief complaint on file.  HPI  OB History    Gravida Para Term Preterm AB TAB SAB Ectopic Multiple Living   0 0 0 0 0 0 3       Patient is 29 y.o. [id number] [redacted]w[redacted]d here with complaints of concern for ruptured membrane. Yesterday, thought she ruptured - thick, white, leaking since. Now thinks it was mucus plug. At 1830 today, felt rush of clear, thin fluid that soaked her shorts.   Braxton hicks for past 3 weeks, more intense last 3 days, today q19min per report.   Endorses spotting x1 of streak on toilet paper, dark pink, yesterday. Lighter spotting x1 last week.  +FM     Past Medical History  Diagnosis Date  . Endometriosis 2011    Dx by lap  . Genital herpes     Past Surgical History  Procedure Laterality Date  . Knee surgery  2009  . Laproscopic exam  2011    Family History  Problem Relation Age of Onset  . Hypertension Mother   . Lupus Mother   . Hypertension Maternal Grandmother   . Diabetes Maternal Grandfather   . Diabetes Paternal Grandmother     History  Substance Use Topics  . Smoking status: Current Some Day Smoker    Types: Cigarettes  . Smokeless tobacco: Never Used  . Alcohol Use: No    Allergies:  Allergies  Allergen Reactions  . Cefzil [Cefprozil] Anaphylaxis and Swelling    Childhood reaction.  . Codeine Nausea And Vomiting    Prescriptions prior to admission  Medication Sig Dispense Refill Last Dose  . acyclovir (ZOVIRAX) 200 MG capsule Take 2 capsules (400 mg total) by mouth 2 (two) times daily. 30 capsule 1 12/08/2014 at Unknown time  . prenatal vitamin w/FE, FA (PRENATAL 1 + 1) 27-1 MG TABS tablet Take 1 tablet by mouth daily. 30 each 0 12/08/2014 at Unknown time    Review of Systems  Constitutional: Negative for fever and chills.  Eyes: Negative for blurred vision.  Respiratory: Negative for shortness of breath and wheezing.    Cardiovascular: Negative for chest pain and palpitations.  Gastrointestinal: Negative for nausea and vomiting.  Musculoskeletal: Positive for back pain.  Neurological: Negative for headaches.  All other systems reviewed and are negative.  Physical Exam   Blood pressure 137/88, pulse 122, temperature 97.8 F (36.6 C), temperature source Oral, resp. rate 16, height  (1.651 m), weight 178 lb (80.74 kg), last menstrual period 03/16/2014, SpO2 100 %, unknown if currently breastfeeding.  Physical Exam  Constitutional: She is oriented to person, place, and time. She appears well-developed and well-nourished. No distress.  HENT:  Head: Normocephalic and atraumatic.  Eyes: EOM are normal. Pupils are equal, round, and reactive to light.  Neck: Neck supple.  Cardiovascular: Normal rate, regular rhythm and intact distal pulses.   Respiratory: Effort normal and breath sounds normal. No respiratory distress.  GI: She exhibits no distension. There is no tenderness.  gravid  Genitourinary:  GYN:  External genitalia within normal limits.  Vaginal mucosa pink, moist, normal rugae.  Nonfriable cervix without lesions, no discharge, pooling, or bleeding noted on speculum exam.  Bimanual exam revealed normal uterus.  No cervical motion tenderness. No adnexal masses bilaterally.    Dilation: Fingertip Effacement (%): Thick Cervical Position: Posterior Exam by:: Dr. Salvadore Oxford  Musculoskeletal: She exhibits no edema or tenderness.  Neurological: She is alert and oriented to person, place, and time. No cranial nerve deficit.  Skin: Skin is warm and dry. No rash noted.  Psychiatric: She has a normal mood and affect. Her behavior is normal. Thought content normal.    MAU Course  Procedures  MDM FHR: 130's with moderate variability and accels, no decels Toco: No UCs Fern negative No pooling on speculum exam  Assessment and Plan  29 y.o. [id number] with viable IUP at 8153w6d No evidence of preterm  labor or ROM.   Patient reassured. Preterm labor information reviewed.  Fetal kick counts reviewed.  Discharge home. Follow up at next scheduled prenatal appt.   Erasmo DownerAngela M Bacigalupo, MD, MPH PGY-2,  [facility] 12/08/2014 11:35 PM   I have participated in the care of this patient and I agree with the above. Cam HaiSHAW, Depaul Arizpe CNM 5:03 AM 12/09/2014

## 2014-12-08 NOTE — MAU Note (Signed)
Pt reports she had a gush of watery fluid at 1830, some contractions.

## 2014-12-08 NOTE — Progress Notes (Signed)
Subjective:    Lori Gentry is a [id number] 6610w6d by 190 week US being seen today for her first obstetrical visit with [facility]. Transfer of care. 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 Patient does intend to breast feed. Pregnancy history fully reviewed.  Patient reports no bleeding, no contractions and no leaking.  Prenatal records from pervious practice reviewed.   Filed Vitals:   12/08/14 0913  BP: 132/82  Pulse: 102  Temp: 97.8 F (36.6 C)  Weight: 178 lb 14.4 oz (81.149 kg)    HISTORY: OB History  Gravida Para Term Preterm AB SAB TAB Ectopic Multiple Living  4 3 3  0 0 0 0 0 0 3    # Outcome Date GA Lbr Len/2nd Weight Sex Delivery Anes PTL Lv  4 Current           3 Term 09/17/13 4248w0d  8 lb 10 oz (3.912 kg) M Vag-Spont EPI N Y  2 Term 05/02/11 3292w0d  7 lb 6 oz (3.345 kg) F Vag-Spont EPI N Y  1 Term 12/13/07 4148w0d  7 lb 8 oz (3.402 kg) M Vag-Spont  N Y     Past Medical History  Diagnosis Date  . Endometriosis 2011    Dx by lap  . Genital herpes    Past Surgical History  Procedure Laterality Date  . Knee surgery  2009  . Laproscopic exam  2011   Family History  Problem Relation Age of Onset  . Hypertension Mother   . Lupus Mother   . Hypertension Maternal Grandmother   . Diabetes Maternal Grandfather   . Diabetes Paternal Grandmother      Exam    Uterus:   36 cm. Pos FHR per doppler.   Pelvic Exam:    Perineum: No Hemorrhoids, Normal Perineum   Vulva: normal, Bartholin's, Urethra, Skene's normal   Vagina:  normal mucosa, normal discharge   pH: NA           Bony Pelvis: proven to 8-10  System: Breast:  declined   Skin: normal coloration and turgor, no rashes    Neurologic: oriented, normal, gait normal; reflexes normal and symmetric, grossly non-focal   Extremities: normal strength, tone, and muscle mass, tr edema. Neg Homan's   HEENT sclera clear, anicteric   Mouth/Teeth mucous membranes moist, pharynx normal without lesions and  dental hygiene good   Neck supple and no masses   Cardiovascular: regular rate and rhythm, no murmurs or gallops   Respiratory:  appears well, vitals normal, no respiratory distress, acyanotic, normal RR, chest clear, no wheezing, crepitations, rhonchi, normal symmetric air entry   Abdomen: soft, non-tender; bowel sounds normal; no masses,  no organomegaly and fundal height 36 cm   Urinary: urethral meatus normal      Assessment:    Pregnancy: G4P3003 1. Supervision of normal pregnancy in third trimester  - Culture, beta strep (group b only) - Cytology - PAP - Tdap (BOOSTRIX) injection 0.5 mL; Inject 0.5 mLs into the muscle once.  2. HSV-2 infection complicating pregnancy, third trimester  - acyclovir (ZOVIRAX) 200 MG capsule; Take 2 capsules (400 mg total) by mouth 2 (two) times daily.  Dispense: 30 capsule; Refill: 1 - Cytology - PAP - Tdap (BOOSTRIX) injection 0.5 mL; Inject 0.5 mLs into the muscle once.  Plan:     Initial labs reviewed. GBS, G/Chlamydia cultures today. Problem list reviewed and updated. Genetic Screening discussed: CF neg. No other genetic screening noted on prenatal records. Ultrasound  discussed; fetal survey: results reviewed. Normal, complete Follow up in 1 weeks.  Lori Gentry 12/08/2014

## 2014-12-09 DIAGNOSIS — Z3A35 35 weeks gestation of pregnancy: Secondary | ICD-10-CM | POA: Diagnosis not present

## 2014-12-09 DIAGNOSIS — O9989 Other specified diseases and conditions complicating pregnancy, childbirth and the puerperium: Secondary | ICD-10-CM | POA: Diagnosis present

## 2014-12-09 DIAGNOSIS — O99333 Smoking (tobacco) complicating pregnancy, third trimester: Secondary | ICD-10-CM | POA: Diagnosis not present

## 2014-12-09 DIAGNOSIS — F17210 Nicotine dependence, cigarettes, uncomplicated: Secondary | ICD-10-CM | POA: Diagnosis not present

## 2014-12-09 NOTE — Discharge Instructions (Signed)
Third Trimester of Pregnancy The third trimester is from week 29 through week 42, months 7 through 9. The third trimester is a time when the fetus is growing rapidly. At the end of the ninth month, the fetus is about 20 inches in length and weighs 6-10 pounds.  BODY CHANGES Your body goes through many changes during pregnancy. The changes vary from woman to woman.   Your weight will continue to increase. You can expect to gain 25-35 pounds (11-16 kg) by the end of the pregnancy.  You may begin to get stretch marks on your hips, abdomen, and breasts.  You may urinate more often because the fetus is moving lower into your pelvis and pressing on your bladder.  You may develop or continue to have heartburn as a result of your pregnancy.  You may develop constipation because certain hormones are causing the muscles that push waste through your intestines to slow down.  You may develop hemorrhoids or swollen, bulging veins (varicose veins).  You may have pelvic pain because of the weight gain and pregnancy hormones relaxing your joints between the bones in your pelvis. Backaches may result from overexertion of the muscles supporting your posture.  You may have changes in your hair. These can include thickening of your hair, rapid growth, and changes in texture. Some women also have hair loss during or after pregnancy, or hair that feels dry or thin. Your hair will most likely return to normal after your baby is born.  Your breasts will continue to grow and be tender. A yellow discharge may leak from your breasts called colostrum.  Your belly button may stick out.  You may feel short of breath because of your expanding uterus.  You may notice the fetus "dropping," or moving lower in your abdomen.  You may have a bloody mucus discharge. This usually occurs a few days to a week before labor begins.  Your cervix becomes thin and soft (effaced) near your due date. WHAT TO EXPECT AT YOUR PRENATAL  EXAMS  You will have prenatal exams every 2 weeks until week 36. Then, you will have weekly prenatal exams. During a routine prenatal visit:  You will be weighed to make sure you and the fetus are growing normally.  Your blood pressure is taken.  Your abdomen will be measured to track your baby's growth.  The fetal heartbeat will be listened to.  Any test results from the previous visit will be discussed.  You may have a cervical check near your due date to see if you have effaced. At around 36 weeks, your caregiver will check your cervix. At the same time, your caregiver will also perform a test on the secretions of the vaginal tissue. This test is to determine if a type of bacteria, Group B streptococcus, is present. Your caregiver will explain this further. Your caregiver may ask you:  What your birth plan is.  How you are feeling.  If you are feeling the baby move.  If you have had any abnormal symptoms, such as leaking fluid, bleeding, severe headaches, or abdominal cramping.  If you have any questions. Other tests or screenings that may be performed during your third trimester include:  Blood tests that check for low iron levels (anemia).  Fetal testing to check the health, activity level, and growth of the fetus. Testing is done if you have certain medical conditions or if there are problems during the pregnancy. FALSE LABOR You may feel small, irregular contractions that   eventually go away. These are called Braxton Hicks contractions, or false labor. Contractions may last for hours, days, or even weeks before true labor sets in. If contractions come at regular intervals, intensify, or become painful, it is best to be seen by your caregiver.  SIGNS OF LABOR   Menstrual-like cramps.  Contractions that are 5 minutes apart or less.  Contractions that start on the top of the uterus and spread down to the lower abdomen and back.  A sense of increased pelvic pressure or back  pain.  A watery or bloody mucus discharge that comes from the vagina. If you have any of these signs before the 37th week of pregnancy, call your caregiver right away. You need to go to the hospital to get checked immediately. HOME CARE INSTRUCTIONS   Avoid all smoking, herbs, alcohol, and unprescribed drugs. These chemicals affect the formation and growth of the baby.  Follow your caregiver's instructions regarding medicine use. There are medicines that are either safe or unsafe to take during pregnancy.  Exercise only as directed by your caregiver. Experiencing uterine cramps is a good sign to stop exercising.  Continue to eat regular, healthy meals.  Wear a good support bra for breast tenderness.  Do not use hot tubs, steam rooms, or saunas.  Wear your seat belt at all times when driving.  Avoid raw meat, uncooked cheese, cat litter boxes, and soil used by cats. These carry germs that can cause birth defects in the baby.  Take your prenatal vitamins.  Try taking a stool softener (if your caregiver approves) if you develop constipation. Eat more high-fiber foods, such as fresh vegetables or fruit and whole grains. Drink plenty of fluids to keep your urine clear or pale yellow.  Take warm sitz baths to soothe any pain or discomfort caused by hemorrhoids. Use hemorrhoid cream if your caregiver approves.  If you develop varicose veins, wear support hose. Elevate your feet for 15 minutes, 3-4 times a day. Limit salt in your diet.  Avoid heavy lifting, wear low heal shoes, and practice good posture.  Rest a lot with your legs elevated if you have leg cramps or low back pain.  Visit your dentist if you have not gone during your pregnancy. Use a soft toothbrush to brush your teeth and be gentle when you floss.  A sexual relationship may be continued unless your caregiver directs you otherwise.  Do not travel far distances unless it is absolutely necessary and only with the approval  of your caregiver.  Take prenatal classes to understand, practice, and ask questions about the labor and delivery.  Make a trial run to the hospital.  Pack your hospital bag.  Prepare the baby's nursery.  Continue to go to all your prenatal visits as directed by your caregiver. SEEK MEDICAL CARE IF:  You are unsure if you are in labor or if your water has broken.  You have dizziness.  You have mild pelvic cramps, pelvic pressure, or nagging pain in your abdominal area.  You have persistent nausea, vomiting, or diarrhea.  You have a bad smelling vaginal discharge.  You have pain with urination. SEEK IMMEDIATE MEDICAL CARE IF:   You have a fever.  You are leaking fluid from your vagina.  You have spotting or bleeding from your vagina.  You have severe abdominal cramping or pain.  You have rapid weight loss or gain.  You have shortness of breath with chest pain.  You notice sudden or extreme swelling   of your face, hands, ankles, feet, or legs.  You have not felt your baby move in over an hour.  You have severe headaches that do not go away with medicine.  You have vision changes. Document Released: 05/08/2001 Document Revised: 05/19/2013 Document Reviewed: 07/15/2012 [employer] Patient Information 2015 [employer]. This information is not intended to replace advice given to you by your health care provider. Make sure you discuss any questions you have with your health care provider.  

## 2014-12-10 LAB — CULTURE, BETA STREP (GROUP B ONLY)

## 2014-12-10 LAB — CYTOLOGY - PAP

## 2014-12-14 ENCOUNTER — Telehealth: Payer: Self-pay | Admitting: General Practice

## 2014-12-14 NOTE — Telephone Encounter (Addendum)
Called patient, no answer- left message stating we are trying to reach you with results, nothing urgent but please call us back at the clinics  7/20  1505  Called home # and spoke w/pt. I advised her of test results and recommendation for OTC Monistat 7 if she is symptomatic.  Pt voiced understanding.  Diane Day RNC

## 2014-12-14 NOTE — Telephone Encounter (Signed)
-----   Message from [place], [place] sent at 12/12/2014  2:40 AM EDT ----- Yeast on Pap. OTC Monistat 7 PRN if symptomatic.

## 2014-12-15 ENCOUNTER — Encounter: Payer: Self-pay | Admitting: Obstetrics and Gynecology

## 2014-12-15 DIAGNOSIS — F17200 Nicotine dependence, unspecified, uncomplicated: Secondary | ICD-10-CM | POA: Insufficient documentation

## 2014-12-15 DIAGNOSIS — O99330 Smoking (tobacco) complicating pregnancy, unspecified trimester: Secondary | ICD-10-CM | POA: Insufficient documentation

## 2014-12-20 ENCOUNTER — Encounter: Payer: Self-pay | Admitting: Obstetrics and Gynecology

## 2014-12-29 ENCOUNTER — Ambulatory Visit (INDEPENDENT_AMBULATORY_CARE_PROVIDER_SITE_OTHER): Payer: Medicaid Other | Admitting: Family Medicine

## 2014-12-29 ENCOUNTER — Encounter: Payer: Self-pay | Admitting: *Deleted

## 2014-12-29 VITALS — BP 133/88 | HR 95 | Temp 97.9°F | Wt 185.5 lb

## 2014-12-29 DIAGNOSIS — Z3483 Encounter for supervision of other normal pregnancy, third trimester: Secondary | ICD-10-CM | POA: Diagnosis present

## 2014-12-29 DIAGNOSIS — B009 Herpesviral infection, unspecified: Secondary | ICD-10-CM

## 2014-12-29 DIAGNOSIS — O98513 Other viral diseases complicating pregnancy, third trimester: Secondary | ICD-10-CM

## 2014-12-29 DIAGNOSIS — Z3493 Encounter for supervision of normal pregnancy, unspecified, third trimester: Secondary | ICD-10-CM

## 2014-12-29 DIAGNOSIS — O99333 Smoking (tobacco) complicating pregnancy, third trimester: Secondary | ICD-10-CM

## 2014-12-29 LAB — POCT URINALYSIS DIP (DEVICE)
BILIRUBIN URINE: NEGATIVE
GLUCOSE, UA: NEGATIVE mg/dL
HGB URINE DIPSTICK: NEGATIVE
KETONES UR: NEGATIVE mg/dL
Nitrite: NEGATIVE
Protein, ur: NEGATIVE mg/dL
SPECIFIC GRAVITY, URINE: 1.015 (ref 1.005–1.030)
Urobilinogen, UA: 0.2 mg/dL (ref 0.0–1.0)
pH: 7 (ref 5.0–8.0)

## 2014-12-29 MED ORDER — ACYCLOVIR 200 MG PO CAPS: 400.0000 mg | ORAL_CAPSULE | Freq: Two times a day (BID) | ORAL | Status: DC

## 2014-12-29 NOTE — Progress Notes (Signed)
Reviewed tip of week with patient  

## 2014-12-29 NOTE — Progress Notes (Signed)
Subjective:  Lori Gentry is a 29 y.o. G4P3003 at [redacted]w[redacted]d being seen today for ongoing prenatal care.  Patient reports no complaints.  Contractions: Irritability.  Vag. Bleeding: None. Movement: Present. Denies leaking of fluid.   The following portions of the patient's history were reviewed and updated as appropriate: allergies, current medications, past family history, past medical history, past social history, past surgical history and problem list.   Objective:   Filed Vitals:   12/29/14 0954  BP: 133/88  Pulse: 95  Temp: 97.9 F (36.6 C)  Weight: 185 lb 8 oz (84.142 kg)    Fetal Status:   Fundal Height: 37 cm Movement: Present  Presentation: Vertex  General:  Alert, oriented and cooperative. Patient is in no acute distress.  Skin: Skin is warm and dry. No rash noted.   Cardiovascular: Normal heart rate noted  Respiratory: Normal respiratory effort, no problems with respiration noted  Abdomen: Soft, gravid, appropriate for gestational age. Pain/Pressure: Present     Vaginal: Vag. Bleeding: None.       Cervix: Exam revealed Dilation: 1 Effacement (%): 0 Station: -3  Extremities: Normal range of motion.  Edema: Trace  Mental Status: Normal mood and affect. Normal behavior. Normal judgment and thought content.   Urinalysis: Urine Protein: Negative Urine Glucose: Negative  Assessment and Plan:  Pregnancy: G4P3003 at [redacted]w[redacted]d  1. Supervision of normal pregnancy in third trimester Signed BTL papers today (12/29/14) discussed 30d waiting period and likely need for outpatient BTL after delivery. Once delivered schedule ASAP Discussed labor precautions Micah Flesher past due date with all children, 2 SVD and 1 IOL Reviewed sweeping membranes if desired at next  2. HSV-2 infection complicating pregnancy, third trimester Refilled acyclovir  3. Tobacco smoking affecting pregnancy, antepartum, third trimester  Term labor symptoms and general obstetric precautions including but not limited to  vaginal bleeding, contractions, leaking of fluid and fetal movement were reviewed in detail with the patient. Please refer to After Visit Summary for other counseling recommendations.  Return in about 1 week (around 01/05/2015) for Routine PNC.   Federico Flake, MD

## 2014-12-29 NOTE — Patient Instructions (Signed)
Third Trimester of Pregnancy The third trimester is from week 29 through week 42, months 7 through 9. The third trimester is a time when the fetus is growing rapidly. At the end of the ninth month, the fetus is about 20 inches in length and weighs 6-10 pounds.  BODY CHANGES Your body goes through many changes during pregnancy. The changes vary from woman to woman.   Your weight will continue to increase. You can expect to gain 25-35 pounds (11-16 kg) by the end of the pregnancy.  You may begin to get stretch marks on your hips, abdomen, and breasts.  You may urinate more often because the fetus is moving lower into your pelvis and pressing on your bladder.  You may develop or continue to have heartburn as a result of your pregnancy.  You may develop constipation because certain hormones are causing the muscles that push waste through your intestines to slow down.  You may develop hemorrhoids or swollen, bulging veins (varicose veins).  You may have pelvic pain because of the weight gain and pregnancy hormones relaxing your joints between the bones in your pelvis. Backaches may result from overexertion of the muscles supporting your posture.  You may have changes in your hair. These can include thickening of your hair, rapid growth, and changes in texture. Some women also have hair loss during or after pregnancy, or hair that feels dry or thin. Your hair will most likely return to normal after your baby is born.  Your breasts will continue to grow and be tender. A yellow discharge may leak from your breasts called colostrum.  Your belly button may stick out.  You may feel short of breath because of your expanding uterus.  You may notice the fetus "dropping," or moving lower in your abdomen.  You may have a bloody mucus discharge. This usually occurs a few days to a week before labor begins.  Your cervix becomes thin and soft (effaced) near your due date. WHAT TO EXPECT AT YOUR PRENATAL  EXAMS  You will have prenatal exams every 2 weeks until week 36. Then, you will have weekly prenatal exams. During a routine prenatal visit:  You will be weighed to make sure you and the fetus are growing normally.  Your blood pressure is taken.  Your abdomen will be measured to track your baby's growth.  The fetal heartbeat will be listened to.  Any test results from the previous visit will be discussed.  You may have a cervical check near your due date to see if you have effaced. At around 36 weeks, your caregiver will check your cervix. At the same time, your caregiver will also perform a test on the secretions of the vaginal tissue. This test is to determine if a type of bacteria, Group B streptococcus, is present. Your caregiver will explain this further. Your caregiver may ask you:  What your birth plan is.  How you are feeling.  If you are feeling the baby move.  If you have had any abnormal symptoms, such as leaking fluid, bleeding, severe headaches, or abdominal cramping.  If you have any questions. Other tests or screenings that may be performed during your third trimester include:  Blood tests that check for low iron levels (anemia).  Fetal testing to check the health, activity level, and growth of the fetus. Testing is done if you have certain medical conditions or if there are problems during the pregnancy. FALSE LABOR You may feel small, irregular contractions that   eventually go away. These are called Braxton Hicks contractions, or false labor. Contractions may last for hours, days, or even weeks before true labor sets in. If contractions come at regular intervals, intensify, or become painful, it is best to be seen by your caregiver.  SIGNS OF LABOR   Menstrual-like cramps.  Contractions that are 5 minutes apart or less.  Contractions that start on the top of the uterus and spread down to the lower abdomen and back.  A sense of increased pelvic pressure or back  pain.  A watery or bloody mucus discharge that comes from the vagina. If you have any of these signs before the 37th week of pregnancy, call your caregiver right away. You need to go to the hospital to get checked immediately. HOME CARE INSTRUCTIONS   Avoid all smoking, herbs, alcohol, and unprescribed drugs. These chemicals affect the formation and growth of the baby.  Follow your caregiver's instructions regarding medicine use. There are medicines that are either safe or unsafe to take during pregnancy.  Exercise only as directed by your caregiver. Experiencing uterine cramps is a good sign to stop exercising.  Continue to eat regular, healthy meals.  Wear a good support bra for breast tenderness.  Do not use hot tubs, steam rooms, or saunas.  Wear your seat belt at all times when driving.  Avoid raw meat, uncooked cheese, cat litter boxes, and soil used by cats. These carry germs that can cause birth defects in the baby.  Take your prenatal vitamins.  Try taking a stool softener (if your caregiver approves) if you develop constipation. Eat more high-fiber foods, such as fresh vegetables or fruit and whole grains. Drink plenty of fluids to keep your urine clear or pale yellow.  Take warm sitz baths to soothe any pain or discomfort caused by hemorrhoids. Use hemorrhoid cream if your caregiver approves.  If you develop varicose veins, wear support hose. Elevate your feet for 15 minutes, 3-4 times a day. Limit salt in your diet.  Avoid heavy lifting, wear low heal shoes, and practice good posture.  Rest a lot with your legs elevated if you have leg cramps or low back pain.  Visit your dentist if you have not gone during your pregnancy. Use a soft toothbrush to brush your teeth and be gentle when you floss.  A sexual relationship may be continued unless your caregiver directs you otherwise.  Do not travel far distances unless it is absolutely necessary and only with the approval  of your caregiver.  Take prenatal classes to understand, practice, and ask questions about the labor and delivery.  Make a trial run to the hospital.  Pack your hospital bag.  Prepare the baby's nursery.  Continue to go to all your prenatal visits as directed by your caregiver. SEEK MEDICAL CARE IF:  You are unsure if you are in labor or if your water has broken.  You have dizziness.  You have mild pelvic cramps, pelvic pressure, or nagging pain in your abdominal area.  You have persistent nausea, vomiting, or diarrhea.  You have a bad smelling vaginal discharge.  You have pain with urination. SEEK IMMEDIATE MEDICAL CARE IF:   You have a fever.  You are leaking fluid from your vagina.  You have spotting or bleeding from your vagina.  You have severe abdominal cramping or pain.  You have rapid weight loss or gain.  You have shortness of breath with chest pain.  You notice sudden or extreme swelling   of your face, hands, ankles, feet, or legs.  You have not felt your baby move in over an hour.  You have severe headaches that do not go away with medicine.  You have vision changes. Document Released: 05/08/2001 Document Revised: 05/19/2013 Document Reviewed: 07/15/2012 [employer] Patient Information 2015 [employer]. This information is not intended to replace advice given to you by your health care Keonda Dow. Make sure you discuss any questions you have with your health care Citlally Captain.  

## 2014-12-31 ENCOUNTER — Encounter (HOSPITAL_COMMUNITY): Payer: Self-pay | Admitting: *Deleted

## 2014-12-31 ENCOUNTER — Inpatient Hospital Stay (HOSPITAL_COMMUNITY)
Admission: AD | Admit: 2014-12-31 | Discharge: 2014-12-31 | Disposition: A | Discharge status: Home / Self Care | Payer: Medicaid Other | Source: Ambulatory Visit | Attending: Obstetrics & Gynecology | Admitting: Obstetrics & Gynecology

## 2014-12-31 DIAGNOSIS — Z3A39 39 weeks gestation of pregnancy: Secondary | ICD-10-CM | POA: Insufficient documentation

## 2014-12-31 DIAGNOSIS — O9989 Other specified diseases and conditions complicating pregnancy, childbirth and the puerperium: Secondary | ICD-10-CM | POA: Diagnosis present

## 2014-12-31 DIAGNOSIS — O471 False labor at or after 37 completed weeks of gestation: Secondary | ICD-10-CM | POA: Insufficient documentation

## 2014-12-31 DIAGNOSIS — O26893 Other specified pregnancy related conditions, third trimester: Secondary | ICD-10-CM

## 2014-12-31 DIAGNOSIS — N898 Other specified noninflammatory disorders of vagina: Secondary | ICD-10-CM

## 2014-12-31 DIAGNOSIS — Z87891 Personal history of nicotine dependence: Secondary | ICD-10-CM | POA: Diagnosis not present

## 2014-12-31 DIAGNOSIS — O479 False labor, unspecified: Secondary | ICD-10-CM

## 2014-12-31 LAB — AMNISURE RUPTURE OF MEMBRANE (ROM) NOT AT ARMC: AMNISURE: NEGATIVE

## 2014-12-31 LAB — POCT FERN TEST: POCT FERN TEST: NEGATIVE

## 2014-12-31 NOTE — MAU Provider Note (Signed)
History     CSN: [id number]  Arrival date and time: 12/31/14 1949   First Provider Initiated Contact with Patient 12/31/14 2051      Chief Complaint  Patient presents with  . Rupture of Membranes   HPI Comments: Pt is a 29 yo f G4P3003 at [redacted]w[redacted]d who presents to MAU w/ complaints of LOF which began around 1730 this evening. She states that she had fluid which was sufficient enough to run down her leg multiple times. She is also complaining of some cramping pain w/ contractions which have been occuring throughout the day. These are irregular and moderate in strength. She reports no VB and fetal movement is normal. She is otherwise w/o complaint.   OB History    Gravida Para Term Preterm AB TAB SAB Ectopic Multiple Living   0 0 0 0 0 0 3      Past Medical History  Diagnosis Date  . Endometriosis 2011    Dx by lap  . Genital herpes     Past Surgical History  Procedure Laterality Date  . Knee surgery  2009  . Laproscopic exam  2011    Family History  Problem Relation Age of Onset  . Hypertension Mother   . Lupus Mother   . Hypertension Maternal Grandmother   . Diabetes Maternal Grandfather   . Diabetes Paternal Grandmother     History  Substance Use Topics  . Smoking status: Former Smoker    Types: Cigarettes    Quit date: 12/24/2014  . Smokeless tobacco: Never Used  . Alcohol Use: No    Allergies:  Allergies  Allergen Reactions  . Cefzil [Cefprozil] Anaphylaxis and Swelling    Childhood reaction.    Prescriptions prior to admission  Medication Sig Dispense Refill Last Dose  . acyclovir (ZOVIRAX) 200 MG capsule Take 2 capsules (400 mg total) by mouth 2 (two) times daily. 30 capsule 3 12/31/2014 at Unknown time  . calcium carbonate (TUMS - DOSED IN MG ELEMENTAL CALCIUM) 500 MG chewable tablet Chew 1 tablet by mouth daily as needed for indigestion or heartburn.   prn at prn  . miconazole (MONISTAT 7) 2 % vaginal cream Place 1 Applicatorful vaginally at  bedtime.   12/30/2014 at Unknown time  . prenatal vitamin w/FE, FA (PRENATAL 1 + 1) 27-1 MG TABS tablet Take 1 tablet by mouth daily. 30 each 0 12/31/2014 at Unknown time  . ranitidine (ZANTAC) 150 MG tablet Take 150 mg by mouth 2 (two) times daily as needed for heartburn.   Past Week at Unknown time    Review of Systems  Respiratory: Negative for shortness of breath.   Cardiovascular: Negative for chest pain and leg swelling.  Gastrointestinal: Negative for nausea and vomiting.  Genitourinary: Negative for urgency and frequency.  Neurological: Negative for headaches.   Physical Exam   Blood pressure 120/71, pulse 111, temperature 98.1 F (36.7 C), temperature source Oral, resp. rate 18, last menstrual period 03/16/2014, unknown if currently breastfeeding.  Physical Exam  Constitutional: She is oriented to person, place, and time. She appears well-developed and well-nourished. No distress.  HENT:  Head: Normocephalic and atraumatic.  Eyes: Conjunctivae and EOM are normal.  Neck: Normal range of motion. No tracheal deviation present.  Cardiovascular: Intact distal pulses.   Respiratory: Effort normal. No respiratory distress.  GI: Soft. She exhibits no distension. There is no tenderness. There is no rebound and no guarding.  Genitourinary: Vagina normal and uterus normal.  Small amount  of physiologic appearing discharge in the vagina, no pooling.  Musculoskeletal: Normal range of motion.  Neurological: She is alert and oriented to person, place, and time.  Skin: Skin is warm and dry. She is not diaphoretic.  Psychiatric: She has a normal mood and affect. Her behavior is normal. Judgment and thought content normal.  Dilation: 1 Effacement (%): 50 Cervical Position: Posterior Station: -3 Presentation: Vertex Exam by:: Fleeta Emmer RN  FHR 130s; mod var; +acels; -decels; irregular contractions/uterine irritability  MAU Course  Procedures  MDM Spec Exam  Assessment and Plan  Pt is a  29 yo f G4P3003 at [redacted]w[redacted]d who presents w/ complaints of LOF and contractions  Possible SOL #blind fern neg; spec fern neg; amnisure neg #fetal strip reassuring and contractions are irregular #likely no SROM and not in labor #d/c home w/ labor precautions  Lowanda Foster 12/31/2014, 9:37 PM   I was present for the exam and agree with above. No pooling seen in SSE.   ASESSMENT:  Intact membranes Braxton Hicks contractions.  [place], CNM 01/01/2015 12:02 AM

## 2014-12-31 NOTE — MAU Note (Signed)
Contractions irregularly Denies bright red vaginal bleeding.  Positive fetal movement Complains of SROM/LOF at 1730 HSV per blood test, no outbreaks, on prophylaxis  GBS negative per patient

## 2014-12-31 NOTE — Discharge Instructions (Signed)
Braxton Hicks Contractions °Contractions of the uterus can occur throughout pregnancy. Contractions are not always a sign that you are in labor.  °WHAT ARE BRAXTON HICKS CONTRACTIONS?  °Contractions that occur before labor are called Braxton Hicks contractions, or false labor. Toward the end of pregnancy (32-34 weeks), these contractions can develop more often and may become more forceful. This is not true labor because these contractions do not result in opening (dilatation) and thinning of the cervix. They are sometimes difficult to tell apart from true labor because these contractions can be forceful and people have different pain tolerances. You should not feel embarrassed if you go to the hospital with false labor. Sometimes, the only way to tell if you are in true labor is for your health care provider to look for changes in the cervix. °If there are no prenatal problems or other health problems associated with the pregnancy, it is completely safe to be sent home with false labor and await the onset of true labor. °HOW CAN YOU TELL THE DIFFERENCE BETWEEN TRUE AND FALSE LABOR? °False Labor °· The contractions of false labor are usually shorter and not as hard as those of true labor.   °· The contractions are usually irregular.   °· The contractions are often felt in the front of the lower abdomen and in the groin.   °· The contractions may go away when you walk around or change positions while lying down.   °· The contractions get weaker and are shorter lasting as time goes on.   °· The contractions do not usually become progressively stronger, regular, and closer together as with true labor.   °True Labor °· Contractions in true labor last 30-70 seconds, become very regular, usually become more intense, and increase in frequency.   °· The contractions do not go away with walking.   °· The discomfort is usually felt in the top of the uterus and spreads to the lower abdomen and low back.   °· True labor can be  determined by your health care provider with an exam. This will show that the cervix is dilating and getting thinner.   °WHAT TO REMEMBER °· Keep up with your usual exercises and follow other instructions given by your health care provider.   °· Take medicines as directed by your health care provider.   °· Keep your regular prenatal appointments.   °· Eat and drink lightly if you think you are going into labor.   °· If Braxton Hicks contractions are making you uncomfortable:   °¨ Change your position from lying down or resting to walking, or from walking to resting.   °¨ Sit and rest in a tub of warm water.   °¨ Drink 2-3 glasses of water. Dehydration may cause these contractions.   °¨ Do slow and deep breathing several times an hour.   °WHEN SHOULD I SEEK IMMEDIATE MEDICAL CARE? °Seek immediate medical care if: °· Your contractions become stronger, more regular, and closer together.   °· You have fluid leaking or gushing from your vagina.   °· You have a fever.   °· You pass blood-tinged mucus.   °· You have vaginal bleeding.   °· You have continuous abdominal pain.   °· You have low back pain that you never had before.   °· You feel your baby's head pushing down and causing pelvic pressure.   °· Your baby is not moving as much as it used to.   °Document Released: 05/14/2005 Document Revised: 05/19/2013 Document Reviewed: 02/23/2013 °[employer]® Patient Information ©2015 [employer]. This information is not intended to replace advice given to you by your health care   provider. Make sure you discuss any questions you have with your health care provider. ° °

## 2015-01-05 ENCOUNTER — Ambulatory Visit (INDEPENDENT_AMBULATORY_CARE_PROVIDER_SITE_OTHER): Payer: Medicaid Other | Admitting: Advanced Practice Midwife

## 2015-01-05 ENCOUNTER — Encounter: Payer: Self-pay | Admitting: Advanced Practice Midwife

## 2015-01-05 VITALS — BP 132/72 | HR 88 | Temp 98.4°F | Wt 186.1 lb

## 2015-01-05 DIAGNOSIS — Z3483 Encounter for supervision of other normal pregnancy, third trimester: Secondary | ICD-10-CM | POA: Diagnosis not present

## 2015-01-05 DIAGNOSIS — Z3493 Encounter for supervision of normal pregnancy, unspecified, third trimester: Secondary | ICD-10-CM

## 2015-01-05 DIAGNOSIS — B009 Herpesviral infection, unspecified: Secondary | ICD-10-CM | POA: Diagnosis not present

## 2015-01-05 DIAGNOSIS — O98513 Other viral diseases complicating pregnancy, third trimester: Secondary | ICD-10-CM | POA: Diagnosis not present

## 2015-01-05 LAB — POCT URINALYSIS DIP (DEVICE)
Bilirubin Urine: NEGATIVE
Glucose, UA: NEGATIVE mg/dL
Hgb urine dipstick: NEGATIVE
KETONES UR: NEGATIVE mg/dL
Nitrite: NEGATIVE
Protein, ur: NEGATIVE mg/dL
SPECIFIC GRAVITY, URINE: 1.025 (ref 1.005–1.030)
Urobilinogen, UA: 0.2 mg/dL (ref 0.0–1.0)
pH: 6.5 (ref 5.0–8.0)

## 2015-01-05 NOTE — Progress Notes (Signed)
Subjective:  Lori Gentry is a 29 y.o. G4P3003 at [redacted]w[redacted]d being seen today for ongoing prenatal care.  Patient reports no leaking and occasional contractions.  Contractions: Irregular.  Vag. Bleeding: None. Movement: Present. Denies leaking of fluid.   The following portions of the patient's history were reviewed and updated as appropriate: allergies, current medications, past family history, past medical history, past social history, past surgical history and problem list.   Objective:   Filed Vitals:   01/05/15 0911  BP: 132/72  Pulse: 88  Temp: 98.4 F (36.9 C)  Weight: 186 lb 1.6 oz (84.414 kg)    Fetal Status: Fetal Heart Rate (bpm): 144   Movement: Present     General:  Alert, oriented and cooperative. Patient is in no acute distress.  Skin: Skin is warm and dry. No rash noted.   Cardiovascular: Normal heart rate noted  Respiratory: Normal respiratory effort, no problems with respiration noted  Abdomen: Soft, gravid, appropriate for gestational age. Pain/Pressure: Present     Pelvic: Vag. Bleeding: None     Cervical exam performed         Cx 1/50//-3/vtx  Extremities: Normal range of motion.  Edema: None  Mental Status: Normal mood and affect. Normal behavior. Normal judgment and thought content.   Urinalysis: Urine Protein: Negative Urine Glucose: Negative  Assessment and Plan:  Pregnancy: [id number] at [redacted]w[redacted]d  There are no diagnoses linked to this encounter. Term labor symptoms and general obstetric precautions including but not limited to vaginal bleeding, contractions, leaking of fluid and fetal movement were reviewed in detail with the patient. Please refer to After Visit Summary for other counseling recommendations.  Return in 1 week  Aviva Signs, CNM

## 2015-01-05 NOTE — Patient Instructions (Signed)

## 2015-01-06 ENCOUNTER — Inpatient Hospital Stay (HOSPITAL_COMMUNITY)
Admission: AD | Admit: 2015-01-06 | Discharge: 2015-01-06 | Disposition: A | Discharge status: Home / Self Care | Payer: Medicaid Other | Source: Ambulatory Visit | Attending: Obstetrics & Gynecology | Admitting: Obstetrics & Gynecology

## 2015-01-06 ENCOUNTER — Encounter (HOSPITAL_COMMUNITY): Payer: Self-pay | Admitting: *Deleted

## 2015-01-06 DIAGNOSIS — Z3493 Encounter for supervision of normal pregnancy, unspecified, third trimester: Secondary | ICD-10-CM | POA: Insufficient documentation

## 2015-01-06 NOTE — MAU Note (Signed)
Pt presents to MAU with complaints of contractions that started last night around 1030 with bloody show starting this morning.

## 2015-01-08 ENCOUNTER — Encounter (HOSPITAL_COMMUNITY): Payer: Self-pay | Admitting: *Deleted

## 2015-01-08 ENCOUNTER — Inpatient Hospital Stay (EMERGENCY_DEPARTMENT_HOSPITAL)
Admission: AD | Admit: 2015-01-08 | Discharge: 2015-01-08 | Disposition: A | Discharge status: Home / Self Care | Payer: Medicaid Other | Source: Ambulatory Visit | Attending: Obstetrics & Gynecology | Admitting: Obstetrics & Gynecology

## 2015-01-08 DIAGNOSIS — O23593 Infection of other part of genital tract in pregnancy, third trimester: Secondary | ICD-10-CM

## 2015-01-08 DIAGNOSIS — B9689 Other specified bacterial agents as the cause of diseases classified elsewhere: Secondary | ICD-10-CM | POA: Diagnosis not present

## 2015-01-08 DIAGNOSIS — O26853 Spotting complicating pregnancy, third trimester: Secondary | ICD-10-CM | POA: Diagnosis not present

## 2015-01-08 DIAGNOSIS — N760 Acute vaginitis: Secondary | ICD-10-CM | POA: Diagnosis not present

## 2015-01-08 LAB — URINALYSIS, ROUTINE W REFLEX MICROSCOPIC
BILIRUBIN URINE: NEGATIVE
Glucose, UA: NEGATIVE mg/dL
Hgb urine dipstick: NEGATIVE
Ketones, ur: NEGATIVE mg/dL
Nitrite: NEGATIVE
Protein, ur: NEGATIVE mg/dL
Specific Gravity, Urine: 1.01 (ref 1.005–1.030)
UROBILINOGEN UA: 1 mg/dL (ref 0.0–1.0)
pH: 6.5 (ref 5.0–8.0)

## 2015-01-08 LAB — WET PREP, GENITAL
Trich, Wet Prep: NONE SEEN
Yeast Wet Prep HPF POC: NONE SEEN

## 2015-01-08 LAB — POCT FERN TEST: POCT Fern Test: NEGATIVE

## 2015-01-08 LAB — URINE MICROSCOPIC-ADD ON

## 2015-01-08 MED ORDER — METRONIDAZOLE 500 MG PO TABS: 500.0000 mg | ORAL_TABLET | Freq: Two times a day (BID) | ORAL | Status: DC

## 2015-01-08 NOTE — Discharge Instructions (Signed)
Bacterial Vaginosis Bacterial vaginosis is a vaginal infection that occurs when the normal balance of bacteria in the vagina is disrupted. It results from an overgrowth of certain bacteria. This is the most common vaginal infection in women of childbearing age. Treatment is important to prevent complications, especially in pregnant women, as it can cause a premature delivery. CAUSES  Bacterial vaginosis is caused by an increase in harmful bacteria that are normally present in smaller amounts in the vagina. Several different kinds of bacteria can cause bacterial vaginosis. However, the reason that the condition develops is not fully understood. RISK FACTORS Certain activities or behaviors can put you at an increased risk of developing bacterial vaginosis, including:  Having a new sex partner or multiple sex partners.  Douching.  Using an intrauterine device (IUD) for contraception. Women do not get bacterial vaginosis from toilet seats, bedding, swimming pools, or contact with objects around them. SIGNS AND SYMPTOMS  Some women with bacterial vaginosis have no signs or symptoms. Common symptoms include:  Grey vaginal discharge.  A fishlike odor with discharge, especially after sexual intercourse.  Itching or burning of the vagina and vulva.  Burning or pain with urination. DIAGNOSIS  Your health care provider will take a medical history and examine the vagina for signs of bacterial vaginosis. A sample of vaginal fluid may be taken. Your health care provider will look at this sample under a microscope to check for bacteria and abnormal cells. A vaginal pH test may also be done.  TREATMENT  Bacterial vaginosis may be treated with antibiotic medicines. These may be given in the form of a pill or a vaginal cream. A second round of antibiotics may be prescribed if the condition comes back after treatment.  HOME CARE INSTRUCTIONS   Only take over-the-counter or prescription medicines as  directed by your health care provider.  If antibiotic medicine was prescribed, take it as directed. Make sure you finish it even if you start to feel better.  Do not have sex until treatment is completed.  Tell all sexual partners that you have a vaginal infection. They should see their health care provider and be treated if they have problems, such as a mild rash or itching.  Practice safe sex by using condoms and only having one sex partner. SEEK MEDICAL CARE IF:   Your symptoms are not improving after 3 days of treatment.  You have increased discharge or pain.  You have a fever. MAKE SURE YOU:   Understand these instructions.  Will watch your condition.  Will get help right away if you are not doing well or get worse. FOR MORE INFORMATION  Centers for Disease Control and Prevention, Division of STD Prevention: SolutionApps.co.zawww.cdc.gov/std American Sexual Health Association (ASHA): www.ashastd.org  Document Released: 05/14/2005 Document Revised: 03/04/2013 Document Reviewed: 12/24/2012 [facility] Patient Information 2015 [place], [place]. This information is not intended to replace advice given to you by your health care provider. Make sure you discuss any questions you have with your health care provider. Third Trimester of Pregnancy The third trimester is from week 29 through week 42, months 7 through 9. The third trimester is a time when the fetus is growing rapidly. At the end of the ninth month, the fetus is about 20 inches in length and weighs 6-10 pounds.  BODY CHANGES Your body goes through many changes during pregnancy. The changes vary from woman to woman.   Your weight will continue to increase. You can expect to gain 25-35 pounds (11-16 kg) by  the end of the pregnancy.  You may begin to get stretch marks on your hips, abdomen, and breasts.  You may urinate more often because the fetus is moving lower into your pelvis and pressing on your bladder.  You may develop or continue to  have heartburn as a result of your pregnancy.  You may develop constipation because certain hormones are causing the muscles that push waste through your intestines to slow down.  You may develop hemorrhoids or swollen, bulging veins (varicose veins).  You may have pelvic pain because of the weight gain and pregnancy hormones relaxing your joints between the bones in your pelvis. Backaches may result from overexertion of the muscles supporting your posture.  You may have changes in your hair. These can include thickening of your hair, rapid growth, and changes in texture. Some women also have hair loss during or after pregnancy, or hair that feels dry or thin. Your hair will most likely return to normal after your baby is born.  Your breasts will continue to grow and be tender. A yellow discharge may leak from your breasts called colostrum.  Your belly button may stick out.  You may feel short of breath because of your expanding uterus.  You may notice the fetus "dropping," or moving lower in your abdomen.  You may have a bloody mucus discharge. This usually occurs a few days to a week before labor begins.  Your cervix becomes thin and soft (effaced) near your due date. WHAT TO EXPECT AT YOUR PRENATAL EXAMS  You will have prenatal exams every 2 weeks until week 36. Then, you will have weekly prenatal exams. During a routine prenatal visit:  You will be weighed to make sure you and the fetus are growing normally.  Your blood pressure is taken.  Your abdomen will be measured to track your baby's growth.  The fetal heartbeat will be listened to.  Any test results from the previous visit will be discussed.  You may have a cervical check near your due date to see if you have effaced. At around 36 weeks, your caregiver will check your cervix. At the same time, your caregiver will also perform a test on the secretions of the vaginal tissue. This test is to determine if a type of bacteria,  Group B streptococcus, is present. Your caregiver will explain this further. Your caregiver may ask you:  What your birth plan is.  How you are feeling.  If you are feeling the baby move.  If you have had any abnormal symptoms, such as leaking fluid, bleeding, severe headaches, or abdominal cramping.  If you have any questions. Other tests or screenings that may be performed during your third trimester include:  Blood tests that check for low iron levels (anemia).  Fetal testing to check the health, activity level, and growth of the fetus. Testing is done if you have certain medical conditions or if there are problems during the pregnancy. FALSE LABOR You may feel small, irregular contractions that eventually go away. These are called Braxton Hicks contractions, or false labor. Contractions may last for hours, days, or even weeks before true labor sets in. If contractions come at regular intervals, intensify, or become painful, it is best to be seen by your caregiver.  SIGNS OF LABOR   Menstrual-like cramps.  Contractions that are 5 minutes apart or less.  Contractions that start on the top of the uterus and spread down to the lower abdomen and back.  A sense  of increased pelvic pressure or back pain.  A watery or bloody mucus discharge that comes from the vagina. If you have any of these signs before the 37th week of pregnancy, call your caregiver right away. You need to go to the hospital to get checked immediately. HOME CARE INSTRUCTIONS   Avoid all smoking, herbs, alcohol, and unprescribed drugs. These chemicals affect the formation and growth of the baby.  Follow your caregiver's instructions regarding medicine use. There are medicines that are either safe or unsafe to take during pregnancy.  Exercise only as directed by your caregiver. Experiencing uterine cramps is a good sign to stop exercising.  Continue to eat regular, healthy meals.  Wear a good support bra for  breast tenderness.  Do not use hot tubs, steam rooms, or saunas.  Wear your seat belt at all times when driving.  Avoid raw meat, uncooked cheese, cat litter boxes, and soil used by cats. These carry germs that can cause birth defects in the baby.  Take your prenatal vitamins.  Try taking a stool softener (if your caregiver approves) if you develop constipation. Eat more high-fiber foods, such as fresh vegetables or fruit and whole grains. Drink plenty of fluids to keep your urine clear or pale yellow.  Take warm sitz baths to soothe any pain or discomfort caused by hemorrhoids. Use hemorrhoid cream if your caregiver approves.  If you develop varicose veins, wear support hose. Elevate your feet for 15 minutes, 3-4 times a day. Limit salt in your diet.  Avoid heavy lifting, wear low heal shoes, and practice good posture.  Rest a lot with your legs elevated if you have leg cramps or low back pain.  Visit your dentist if you have not gone during your pregnancy. Use a soft toothbrush to brush your teeth and be gentle when you floss.  A sexual relationship may be continued unless your caregiver directs you otherwise.  Do not travel far distances unless it is absolutely necessary and only with the approval of your caregiver.  Take prenatal classes to understand, practice, and ask questions about the labor and delivery.  Make a trial run to the hospital.  Pack your hospital bag.  Prepare the baby's nursery.  Continue to go to all your prenatal visits as directed by your caregiver. SEEK MEDICAL CARE IF:  You are unsure if you are in labor or if your water has broken.  You have dizziness.  You have mild pelvic cramps, pelvic pressure, or nagging pain in your abdominal area.  You have persistent nausea, vomiting, or diarrhea.  You have a bad smelling vaginal discharge.  You have pain with urination. SEEK IMMEDIATE MEDICAL CARE IF:   You have a fever.  You are leaking fluid  from your vagina.  You have spotting or bleeding from your vagina.  You have severe abdominal cramping or pain.  You have rapid weight loss or gain.  You have shortness of breath with chest pain.  You notice sudden or extreme swelling of your face, hands, ankles, feet, or legs.  You have not felt your baby move in over an hour.  You have severe headaches that do not go away with medicine.  You have vision changes. Document Released: 05/08/2001 Document Revised: 05/19/2013 Document Reviewed: 07/15/2012 [facility] Patient Information 2015 [place], [place]. This information is not intended to replace advice given to you by your health care provider. Make sure you discuss any questions you have with your health care provider. Fetal Movement Counts  Patient Name: __________________________________________________ Patient Due Date: ____________________ Performing a fetal movement count is highly recommended in high-risk pregnancies, but it is good for every pregnant woman to do. Your health care provider may ask you to start counting fetal movements at 28 weeks of the pregnancy. Fetal movements often increase:  After eating a full meal.  After physical activity.  After eating or drinking something sweet or cold.  At rest. Pay attention to when you feel the baby is most active. This will help you notice a pattern of your baby's sleep and wake cycles and what factors contribute to an increase in fetal movement. It is important to perform a fetal movement count at the same time each day when your baby is normally most active.  HOW TO COUNT FETAL MOVEMENTS  Find a quiet and comfortable area to sit or lie down on your left side. Lying on your left side provides the best blood and oxygen circulation to your baby.  Write down the day and time on a sheet of paper or in a journal.  Start counting kicks, flutters, swishes, rolls, or jabs in a 2-hour period. You should feel at least 10 movements  within 2 hours.  If you do not feel 10 movements in 2 hours, wait 2-3 hours and count again. Look for a change in the pattern or not enough counts in 2 hours. SEEK MEDICAL CARE IF:  You feel less than 10 counts in 2 hours, tried twice.  There is no movement in over an hour.  The pattern is changing or taking longer each day to reach 10 counts in 2 hours.  You feel the baby is not moving as he or she usually does. Date: ____________ Movements: ____________ Start time: ____________ Doreatha Martin time: ____________  Date: ____________ Movements: ____________ Start time: ____________ Doreatha Martin time: ____________ Date: ____________ Movements: ____________ Start time: ____________ Doreatha Martin time: ____________ Date: ____________ Movements: ____________ Start time: ____________ Doreatha Martin time: ____________ Date: ____________ Movements: ____________ Start time: ____________ Doreatha Martin time: ____________ Date: ____________ Movements: ____________ Start time: ____________ Doreatha Martin time: ____________ Date: ____________ Movements: ____________ Start time: ____________ Doreatha Martin time: ____________ Date: ____________ Movements: ____________ Start time: ____________ Doreatha Martin time: ____________  Date: ____________ Movements: ____________ Start time: ____________ Doreatha Martin time: ____________ Date: ____________ Movements: ____________ Start time: ____________ Doreatha Martin time: ____________ Date: ____________ Movements: ____________ Start time: ____________ Doreatha Martin time: ____________ Date: ____________ Movements: ____________ Start time: ____________ Doreatha Martin time: ____________ Date: ____________ Movements: ____________ Start time: ____________ Doreatha Martin time: ____________ Date: ____________ Movements: ____________ Start time: ____________ Doreatha Martin time: ____________ Date: ____________ Movements: ____________ Start time: ____________ Doreatha Martin time: ____________  Date: ____________ Movements: ____________ Start time: ____________ Doreatha Martin time:  ____________ Date: ____________ Movements: ____________ Start time: ____________ Doreatha Martin time: ____________ Date: ____________ Movements: ____________ Start time: ____________ Doreatha Martin time: ____________ Date: ____________ Movements: ____________ Start time: ____________ Doreatha Martin time: ____________ Date: ____________ Movements: ____________ Start time: ____________ Doreatha Martin time: ____________ Date: ____________ Movements: ____________ Start time: ____________ Doreatha Martin time: ____________ Date: ____________ Movements: ____________ Start time: ____________ Doreatha Martin time: ____________  Date: ____________ Movements: ____________ Start time: ____________ Doreatha Martin time: ____________ Date: ____________ Movements: ____________ Start time: ____________ Doreatha Martin time: ____________ Date: ____________ Movements: ____________ Start time: ____________ Doreatha Martin time: ____________ Date: ____________ Movements: ____________ Start time: ____________ Doreatha Martin time: ____________ Date: ____________ Movements: ____________ Start time: ____________ Doreatha Martin time: ____________ Date: ____________ Movements: ____________ Start time: ____________ Doreatha Martin time: ____________ Date: ____________ Movements: ____________ Start time: ____________ Doreatha Martin time: ____________  Date: ____________ Movements: ____________ Start time: ____________ Doreatha Martin time: ____________ Date:  ____________ Movements: ____________ Start time: ____________ Doreatha Martin time: ____________ Date: ____________ Movements: ____________ Start time: ____________ Doreatha Martin time: ____________ Date: ____________ Movements: ____________ Start time: ____________ Doreatha Martin time: ____________ Date: ____________ Movements: ____________ Start time: ____________ Doreatha Martin time: ____________ Date: ____________ Movements: ____________ Start time: ____________ Doreatha Martin time: ____________ Date: ____________ Movements: ____________ Start time: ____________ Doreatha Martin time: ____________  Date: ____________ Movements:  ____________ Start time: ____________ Doreatha Martin time: ____________ Date: ____________ Movements: ____________ Start time: ____________ Doreatha Martin time: ____________ Date: ____________ Movements: ____________ Start time: ____________ Doreatha Martin time: ____________ Date: ____________ Movements: ____________ Start time: ____________ Doreatha Martin time: ____________ Date: ____________ Movements: ____________ Start time: ____________ Doreatha Martin time: ____________ Date: ____________ Movements: ____________ Start time: ____________ Doreatha Martin time: ____________ Date: ____________ Movements: ____________ Start time: ____________ Doreatha Martin time: ____________  Date: ____________ Movements: ____________ Start time: ____________ Doreatha Martin time: ____________ Date: ____________ Movements: ____________ Start time: ____________ Doreatha Martin time: ____________ Date: ____________ Movements: ____________ Start time: ____________ Doreatha Martin time: ____________ Date: ____________ Movements: ____________ Start time: ____________ Doreatha Martin time: ____________ Date: ____________ Movements: ____________ Start time: ____________ Doreatha Martin time: ____________ Date: ____________ Movements: ____________ Start time: ____________ Doreatha Martin time: ____________ Date: ____________ Movements: ____________ Start time: ____________ Doreatha Martin time: ____________  Date: ____________ Movements: ____________ Start time: ____________ Doreatha Martin time: ____________ Date: ____________ Movements: ____________ Start time: ____________ Doreatha Martin time: ____________ Date: ____________ Movements: ____________ Start time: ____________ Doreatha Martin time: ____________ Date: ____________ Movements: ____________ Start time: ____________ Doreatha Martin time: ____________ Date: ____________ Movements: ____________ Start time: ____________ Doreatha Martin time: ____________ Date: ____________ Movements: ____________ Start time: ____________ Doreatha Martin time: ____________ Document Released: 06/13/2006 Document Revised: 09/28/2013 Document Reviewed:  03/10/2012 [employer] Patient Information 2015 [place]. This information is not intended to replace advice given to you by your health care provider. Make sure you discuss any questions you have with your health care provider. Braxton Hicks Contractions Contractions of the uterus can occur throughout pregnancy. Contractions are not always a sign that you are in labor.  WHAT ARE BRAXTON HICKS CONTRACTIONS?  Contractions that occur before labor are called Braxton Hicks contractions, or false labor. Toward the end of pregnancy (32-34 weeks), these contractions can develop more often and may become more forceful. This is not true labor because these contractions do not result in opening (dilatation) and thinning of the cervix. They are sometimes difficult to tell apart from true labor because these contractions can be forceful and people have different pain tolerances. You should not feel embarrassed if you go to the hospital with false labor. Sometimes, the only way to tell if you are in true labor is for your health care provider to look for changes in the cervix. If there are no prenatal problems or other health problems associated with the pregnancy, it is completely safe to be sent home with false labor and await the onset of true labor. HOW CAN YOU TELL THE DIFFERENCE BETWEEN TRUE AND FALSE LABOR? False Labor  The contractions of false labor are usually shorter and not as hard as those of true labor.   The contractions are usually irregular.   The contractions are often felt in the front of the lower abdomen and in the groin.   The contractions may go away when you walk around or change positions while lying down.   The contractions get weaker and are shorter lasting as time goes on.   The contractions do not usually become progressively stronger, regular, and closer together as with true labor.  True Labor  Contractions in true labor last 30-70 seconds, become very regular,  usually become more intense, and increase in frequency.   The contractions do not go away with walking.   The discomfort is usually felt in the top of the uterus and spreads to the lower abdomen and low back.   True labor can be determined by your health care provider with an exam. This will show that the cervix is dilating and getting thinner.  WHAT TO REMEMBER  Keep up with your usual exercises and follow other instructions given by your health care provider.   Take medicines as directed by your health care provider.   Keep your regular prenatal appointments.   Eat and drink lightly if you think you are going into labor.   If Braxton Hicks contractions are making you uncomfortable:   Change your position from lying down or resting to walking, or from walking to resting.   Sit and rest in a tub of warm water.   Drink 2-3 glasses of water. Dehydration may cause these contractions.   Do slow and deep breathing several times an hour.  WHEN SHOULD I SEEK IMMEDIATE MEDICAL CARE? Seek immediate medical care if:  Your contractions become stronger, more regular, and closer together.   You have fluid leaking or gushing from your vagina.   You have a fever.   You pass blood-tinged mucus.   You have vaginal bleeding.   You have continuous abdominal pain.   You have low back pain that you never had before.   You feel your baby's head pushing down and causing pelvic pressure.   Your baby is not moving as much as it used to.  Document Released: 05/14/2005 Document Revised: 05/19/2013 Document Reviewed: 02/23/2013 [facility] Patient Information 2015 [place], [place]. This information is not intended to replace advice given to you by your health care provider. Make sure you discuss any questions you have with your health care provider.

## 2015-01-08 NOTE — MAU Note (Signed)
Pt states she has been having vaginal bleeding for 3 days.  Pt states that the bleeding has been as heavy as a light period and multiple times she has gone to the bathroom and had stingy bloody discharge.  Pt states she went to the doctor on Wednesday 01/05/15 and they did a membrane sweep.  Pt states she is feeling the baby move but the movement is decreased.

## 2015-01-08 NOTE — Progress Notes (Signed)
Notified of pt arrival in MAU and complaint of bleeding. Will come see pt

## 2015-01-08 NOTE — MAU Note (Signed)
Pt states here for vaginal bleeding x3 days. Blood is now starting to saturate pad. Told to come here for eval. Irregular ctx's. Got up yesterday am and had large amounts of stringy mucus hanging out of her vagina.

## 2015-01-08 NOTE — MAU Provider Note (Signed)
History  Chief Complaint:  Vaginal Bleeding  Lori Gentry is a 29 y.o. [phone] female at [redacted]w[redacted]d presenting w/ report of light period type bleeding x last 3 days. Membranes were stripped in office on Wednesday, and began after. Was seen in [facility] on Thursday for same- was thought to be normal spotting after having membranes stripping. Pt upset b/c she was not seen by provider at that time.    Reports decreased  fetal movement, contractions: irregular, vaginal bleeding: 'light period', enough to change a pad 3-5x/day- reddish-pink, no clots, membranes: reports she is unsure if what she is having is bloody lof. Denies uti s/s, abnormal/malodorous vag d/c or vulvovaginal itching/irritation.   Prenatal care at [facility].  Next visit Wed. 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 Endometriosis 2011    Dx by lap  . Genital herpes     Past Surgical History: Past Surgical History  Procedure Laterality Date  . Knee surgery  2009  . Laproscopic exam  2011    Social History: Social History   Social History  . Marital Status: Legally Separated    Spouse Name: N/A  . Number of Children: N/A  . Years of Education: N/A   Social History Main Topics  . Smoking status: Former Smoker    Types: Cigarettes    Quit date: 12/24/2014  . Smokeless tobacco: Never Used  . Alcohol Use: No  . Drug Use: No  . Sexual Activity: Yes    Birth Control/ Protection: None   Other Topics Concern  . None   Social History Narrative    Allergies: Allergies  Allergen Reactions  . Cefzil [Cefprozil] Anaphylaxis and Swelling    Prescriptions prior to admission  Medication Sig Dispense Refill Last Dose  . acyclovir (ZOVIRAX) 200 MG capsule Take 2 capsules (400 mg total) by mouth 2 (two) times daily. 30 capsule 3  01/08/2015 at Unknown time  . calcium carbonate (TUMS - DOSED IN MG ELEMENTAL CALCIUM) 500 MG chewable tablet Chew 1 tablet by mouth daily as needed for indigestion or heartburn.   Past Week at Unknown time  . prenatal vitamin w/FE, FA (PRENATAL 1 + 1) 27-1 MG TABS tablet Take 1 tablet by mouth daily. 30 each 0 01/08/2015 at Unknown time    Review of Systems  Pertinent pos/neg as indicated in HPI  Physical Exam  Blood pressure 132/79, pulse 115, temperature 98.3 F (36.8 C), temperature source Oral, resp. rate 20, last menstrual period 03/16/2014, not currently breastfeeding. General appearance: alert, cooperative and no distress Lungs: clear to auscultation bilaterally, normal effort Heart: regular rate and rhythm Abdomen: gravid, soft, non-tender Extremities: Tr edema DTR's 2+  Spec exam: Cx visually closed, no blood at all in vault, small amount thin frothy slightly malodorous d/c, no pooling- no change w/ valsalva Cultures/Specimens: wet prep, fern neg  Dilation: 2.5 Effacement (%): 60, 70 Station: Ballotable Presentation: Vertex Exam by:: Genella Rife CNM Presentation: cephalic  Fetal monitoring: FHR: 125 bpm, variability: moderate,  Accelerations: Present,  decelerations:  Absent Uterine activity: irregular  MAU Course  NST Spec exam w/ wet prep and neg fern SVE  Labs:  Results for orders placed or performed during the hospital encounter of 01/08/15 (from the past 24 hour(s))  Urinalysis, Routine w reflex microscopic (not at [facility])     Status: Abnormal   Collection Time: 01/08/15  6:33 PM  Result Value Ref Range   Color, Urine YELLOW YELLOW   APPearance CLEAR CLEAR   Specific Gravity, Urine 1.010 1.005 - 1.030   pH 6.5 5.0 - 8.0   Glucose, UA NEGATIVE NEGATIVE mg/dL   Hgb urine dipstick NEGATIVE NEGATIVE   Bilirubin Urine NEGATIVE NEGATIVE   Ketones, ur NEGATIVE NEGATIVE mg/dL   Protein, ur NEGATIVE NEGATIVE mg/dL   Urobilinogen, UA 1.0 0.0 - 1.0 mg/dL   Nitrite  NEGATIVE NEGATIVE   Leukocytes, UA SMALL (A) NEGATIVE  Urine microscopic-add on     Status: Abnormal   Collection Time: 01/08/15  6:33 PM  Result Value Ref Range   Squamous Epithelial / LPF FEW (A) RARE   WBC, UA 3-6 <3 WBC/hpf   RBC / HPF 0-2 <3 RBC/hpf   Bacteria, UA RARE RARE  Wet prep, genital     Status: Abnormal   Collection Time: 01/08/15  7:05 PM  Result Value Ref Range   Yeast Wet Prep HPF POC NONE SEEN NONE SEEN   Trich, Wet Prep NONE SEEN NONE SEEN   Clue Cells Wet Prep HPF POC FEW (A) NONE SEEN   WBC, Wet Prep HPF POC FEW (A) NONE SEEN  Fern Test     Status: Normal   Collection Time: 01/08/15  7:14 PM  Result Value Ref Range   POCT Fern Test Negative = intact amniotic membranes     Imaging:  n/a  Assessment and Plan  A:  [redacted]w[redacted]d [facility]  G4P3003  BV  Spotting  Cat 1 FHR P:  D/C home  Rx metronidazole  bid x 7d, no sex  Reviewed labor s/s, fkc  Keep next appt at [facility] on Wed as scheduled   Marge Duncans CNM,WHNP-BC 8/13/20167:57 PM

## 2015-01-09 ENCOUNTER — Inpatient Hospital Stay (HOSPITAL_COMMUNITY): Payer: Medicaid Other | Admitting: Anesthesiology

## 2015-01-09 ENCOUNTER — Inpatient Hospital Stay (HOSPITAL_COMMUNITY)
Admission: AD | Admit: 2015-01-09 | Discharge: 2015-01-10 | DRG: 774 | Disposition: A | Discharge status: Home / Self Care | Payer: Medicaid Other | Source: Ambulatory Visit | Attending: Obstetrics & Gynecology | Admitting: Obstetrics & Gynecology

## 2015-01-09 ENCOUNTER — Encounter (HOSPITAL_COMMUNITY): Payer: Self-pay | Admitting: *Deleted

## 2015-01-09 DIAGNOSIS — O99333 Smoking (tobacco) complicating pregnancy, third trimester: Secondary | ICD-10-CM

## 2015-01-09 DIAGNOSIS — Z3A40 40 weeks gestation of pregnancy: Secondary | ICD-10-CM | POA: Diagnosis present

## 2015-01-09 DIAGNOSIS — Z8249 Family history of ischemic heart disease and other diseases of the circulatory system: Secondary | ICD-10-CM | POA: Diagnosis not present

## 2015-01-09 DIAGNOSIS — O9832 Other infections with a predominantly sexual mode of transmission complicating childbirth: Principal | ICD-10-CM | POA: Diagnosis present

## 2015-01-09 DIAGNOSIS — Z833 Family history of diabetes mellitus: Secondary | ICD-10-CM | POA: Diagnosis not present

## 2015-01-09 DIAGNOSIS — O98513 Other viral diseases complicating pregnancy, third trimester: Secondary | ICD-10-CM

## 2015-01-09 DIAGNOSIS — A6000 Herpesviral infection of urogenital system, unspecified: Secondary | ICD-10-CM | POA: Diagnosis present

## 2015-01-09 DIAGNOSIS — N809 Endometriosis, unspecified: Secondary | ICD-10-CM | POA: Diagnosis present

## 2015-01-09 DIAGNOSIS — IMO0001 Reserved for inherently not codable concepts without codable children: Secondary | ICD-10-CM

## 2015-01-09 DIAGNOSIS — B009 Herpesviral infection, unspecified: Secondary | ICD-10-CM

## 2015-01-09 DIAGNOSIS — Z87891 Personal history of nicotine dependence: Secondary | ICD-10-CM | POA: Diagnosis not present

## 2015-01-09 DIAGNOSIS — Z8349 Family history of other endocrine, nutritional and metabolic diseases: Secondary | ICD-10-CM

## 2015-01-09 LAB — CBC
HEMATOCRIT: 32.9 % — AB (ref 36.0–46.0)
Hemoglobin: 11.1 g/dL — ABNORMAL LOW (ref 12.0–15.0)
MCH: 31.1 pg (ref 26.0–34.0)
MCHC: 33.7 g/dL (ref 30.0–36.0)
MCV: 92.2 fL (ref 78.0–100.0)
Platelets: 336 10*3/uL (ref 150–400)
RBC: 3.57 MIL/uL — AB (ref 3.87–5.11)
RDW: 14.4 % (ref 11.5–15.5)
WBC: 11.6 10*3/uL — AB (ref 4.0–10.5)

## 2015-01-09 LAB — RPR: RPR: NONREACTIVE

## 2015-01-09 LAB — TYPE AND SCREEN
ABO/RH(D): A POS
Antibody Screen: NEGATIVE

## 2015-01-09 LAB — HIV ANTIBODY (ROUTINE TESTING W REFLEX): HIV SCREEN 4TH GENERATION: NONREACTIVE

## 2015-01-09 MED ORDER — WITCH HAZEL-GLYCERIN EX PADS: 1.0000 "application " | MEDICATED_PAD | CUTANEOUS | Status: DC | PRN

## 2015-01-09 MED ORDER — TETANUS-DIPHTH-ACELL PERTUSSIS 5-2.5-18.5 LF-MCG/0.5 IM SUSP: 0.5000 mL | Freq: Once | INTRAMUSCULAR | Status: DC

## 2015-01-09 MED ORDER — LACTATED RINGERS IV SOLN
500.0000 mL | INTRAVENOUS | Status: DC | PRN
  Administered 2015-01-09: 500 mL via INTRAVENOUS

## 2015-01-09 MED ORDER — LACTATED RINGERS IV SOLN
INTRAVENOUS | Status: DC
  Administered 2015-01-09: 02:00:00 via INTRAVENOUS

## 2015-01-09 MED ORDER — ONDANSETRON HCL 4 MG/2ML IJ SOLN: 4.0000 mg | Freq: Four times a day (QID) | INTRAMUSCULAR | Status: DC | PRN

## 2015-01-09 MED ORDER — FENTANYL CITRATE (PF) 100 MCG/2ML IJ SOLN
INTRAMUSCULAR | Status: AC
  Filled 2015-01-09: qty 2

## 2015-01-09 MED ORDER — ACETAMINOPHEN 325 MG PO TABS: 650.0000 mg | ORAL_TABLET | ORAL | Status: DC | PRN

## 2015-01-09 MED ORDER — ONDANSETRON HCL 4 MG/2ML IJ SOLN: 4.0000 mg | INTRAMUSCULAR | Status: DC | PRN

## 2015-01-09 MED ORDER — DIPHENHYDRAMINE HCL 25 MG PO CAPS: 25.0000 mg | ORAL_CAPSULE | Freq: Four times a day (QID) | ORAL | Status: DC | PRN

## 2015-01-09 MED ORDER — LANOLIN HYDROUS EX OINT: TOPICAL_OINTMENT | CUTANEOUS | Status: DC | PRN

## 2015-01-09 MED ORDER — OXYTOCIN BOLUS FROM INFUSION
500.0000 mL | INTRAVENOUS | Status: DC
  Administered 2015-01-09: 500 mL via INTRAVENOUS

## 2015-01-09 MED ORDER — HYDROXYZINE HCL 50 MG PO TABS
50.0000 mg | ORAL_TABLET | Freq: Four times a day (QID) | ORAL | Status: DC | PRN
  Filled 2015-01-09: qty 1

## 2015-01-09 MED ORDER — PHENYLEPHRINE 40 MCG/ML (10ML) SYRINGE FOR IV PUSH (FOR BLOOD PRESSURE SUPPORT)
80.0000 ug | PREFILLED_SYRINGE | INTRAVENOUS | Status: DC | PRN
  Filled 2015-01-09: qty 20
  Filled 2015-01-09: qty 2

## 2015-01-09 MED ORDER — IBUPROFEN 600 MG PO TABS
600.0000 mg | ORAL_TABLET | Freq: Four times a day (QID) | ORAL | Status: DC
  Administered 2015-01-09 – 2015-01-10 (×6): 600 mg via ORAL
  Filled 2015-01-09 (×6): qty 1

## 2015-01-09 MED ORDER — SIMETHICONE 80 MG PO CHEW: 80.0000 mg | CHEWABLE_TABLET | ORAL | Status: DC | PRN

## 2015-01-09 MED ORDER — OXYCODONE-ACETAMINOPHEN 5-325 MG PO TABS: 2.0000 | ORAL_TABLET | ORAL | Status: DC | PRN

## 2015-01-09 MED ORDER — ONDANSETRON HCL 4 MG PO TABS: 4.0000 mg | ORAL_TABLET | ORAL | Status: DC | PRN

## 2015-01-09 MED ORDER — OXYCODONE-ACETAMINOPHEN 5-325 MG PO TABS: 1.0000 | ORAL_TABLET | ORAL | Status: DC | PRN

## 2015-01-09 MED ORDER — ZOLPIDEM TARTRATE 5 MG PO TABS: 5.0000 mg | ORAL_TABLET | Freq: Every evening | ORAL | Status: DC | PRN

## 2015-01-09 MED ORDER — EPHEDRINE 5 MG/ML INJ
10.0000 mg | INTRAVENOUS | Status: DC | PRN
  Filled 2015-01-09: qty 2

## 2015-01-09 MED ORDER — SENNOSIDES-DOCUSATE SODIUM 8.6-50 MG PO TABS
2.0000 | ORAL_TABLET | ORAL | Status: DC
  Administered 2015-01-10: 2 via ORAL
  Filled 2015-01-09: qty 2

## 2015-01-09 MED ORDER — LIDOCAINE HCL (PF) 1 % IJ SOLN
30.0000 mL | INTRAMUSCULAR | Status: DC | PRN
  Filled 2015-01-09: qty 30

## 2015-01-09 MED ORDER — FLEET ENEMA 7-19 GM/118ML RE ENEM: 1.0000 | ENEMA | RECTAL | Status: DC | PRN

## 2015-01-09 MED ORDER — CITRIC ACID-SODIUM CITRATE 334-500 MG/5ML PO SOLN: 30.0000 mL | ORAL | Status: DC | PRN

## 2015-01-09 MED ORDER — BUPIVACAINE HCL (PF) 0.25 % IJ SOLN
INTRAMUSCULAR | Status: DC | PRN
  Administered 2015-01-09 (×2): 4 mL

## 2015-01-09 MED ORDER — OXYCODONE-ACETAMINOPHEN 5-325 MG PO TABS
1.0000 | ORAL_TABLET | ORAL | Status: DC | PRN
  Administered 2015-01-09 – 2015-01-10 (×3): 1 via ORAL
  Filled 2015-01-09 (×3): qty 1

## 2015-01-09 MED ORDER — BENZOCAINE-MENTHOL 20-0.5 % EX AERO: 1.0000 "application " | INHALATION_SPRAY | CUTANEOUS | Status: DC | PRN

## 2015-01-09 MED ORDER — PRENATAL MULTIVITAMIN CH
1.0000 | ORAL_TABLET | Freq: Every day | ORAL | Status: DC
  Administered 2015-01-09 – 2015-01-10 (×2): 1 via ORAL
  Filled 2015-01-09 (×2): qty 1

## 2015-01-09 MED ORDER — OXYTOCIN 40 UNITS IN LACTATED RINGERS INFUSION - SIMPLE MED
62.5000 mL/h | INTRAVENOUS | Status: DC
  Administered 2015-01-09: 62.5 mL/h via INTRAVENOUS
  Filled 2015-01-09: qty 1000

## 2015-01-09 MED ORDER — LIDOCAINE-EPINEPHRINE (PF) 2 %-1:200000 IJ SOLN
INTRAMUSCULAR | Status: DC | PRN
Start: 2015-01-09 — End: 2015-01-09
  Administered 2015-01-09: 4 mL

## 2015-01-09 MED ORDER — DIPHENHYDRAMINE HCL 50 MG/ML IJ SOLN: 12.5000 mg | INTRAMUSCULAR | Status: DC | PRN

## 2015-01-09 MED ORDER — DIBUCAINE 1 % RE OINT: 1.0000 "application " | TOPICAL_OINTMENT | RECTAL | Status: DC | PRN

## 2015-01-09 MED ORDER — FENTANYL 2.5 MCG/ML BUPIVACAINE 1/10 % EPIDURAL INFUSION (WH - ANES)
14.0000 mL/h | INTRAMUSCULAR | Status: DC | PRN
  Administered 2015-01-09: 12 mL/h via EPIDURAL
  Administered 2015-01-09: 14 mL/h via EPIDURAL
  Filled 2015-01-09: qty 125

## 2015-01-09 MED ORDER — FENTANYL CITRATE (PF) 100 MCG/2ML IJ SOLN
100.0000 ug | INTRAMUSCULAR | Status: DC | PRN
  Administered 2015-01-09: 100 ug via INTRAVENOUS

## 2015-01-09 NOTE — MAU Note (Signed)
PT  HAS RETURNED  - WAS HERE  TODAY   VE  2-3 CM.    DENIES  HSV  OUTBREAK.    GBS- NEG

## 2015-01-09 NOTE — Anesthesia Postprocedure Evaluation (Signed)
  Anesthesia Post-op Note  Patient: Lori Gentry  Procedure(s) Performed: * No procedures listed *  Patient Location: Mother/Baby  Anesthesia Type:Epidural  Level of Consciousness: awake, alert  and oriented  Airway and Oxygen Therapy: Patient Spontanous Breathing  Post-op Pain: none  Post-op Assessment: Post-op Vital signs reviewed and Patient's Cardiovascular Status Stable              Post-op Vital Signs: Reviewed and stable  Last Vitals:  Filed Vitals:   01/09/15 0615  BP: 122/72  Pulse: 98  Temp: 36.8 C  Resp: 18    Complications: No apparent anesthesia complications

## 2015-01-09 NOTE — Anesthesia Preprocedure Evaluation (Signed)
Anesthesia Evaluation  Patient identified by MRN, date of birth, ID band Patient awake    Reviewed: Allergy & Precautions, Patient's Chart, lab work & pertinent test results  History of Anesthesia Complications Negative for: history of anesthetic complications  Airway Mallampati: III  TM Distance: >3 FB Neck ROM: Full    Dental  (+) Teeth Intact   Pulmonary former smoker,    breath sounds clear to auscultation       Cardiovascular negative cardio ROS   Rhythm:Regular     Neuro/Psych negative neurological ROS  negative psych ROS   GI/Hepatic negative GI ROS, Neg liver ROS,   Endo/Other  negative endocrine ROS  Renal/GU negative Renal ROS     Musculoskeletal   Abdominal   Peds  Hematology negative hematology ROS (+)   Anesthesia Other Findings   Reproductive/Obstetrics (+) Pregnancy                             Anesthesia Physical Anesthesia Plan  ASA: II  Anesthesia Plan: Epidural   Post-op Pain Management:    Induction:   Airway Management Planned:   Additional Equipment:   Intra-op Plan:   Post-operative Plan:   Informed Consent: I have reviewed the patients History and Physical, chart, labs and discussed the procedure including the risks, benefits and alternatives for the proposed anesthesia with the patient or authorized representative who has indicated his/her understanding and acceptance.   Dental advisory given  Plan Discussed with: Anesthesiologist  Anesthesia Plan Comments:         Anesthesia Quick Evaluation  

## 2015-01-09 NOTE — Lactation Note (Signed)
This note was copied from the chart of Lori Gentry. Lactation Consultation Note  Initial visit made.  Breastfeeding consultation services and support information given to patient.  Baby is 7 hours old and just came off breast after 60 minutes.  Mom states she quit breastfeeding her last baby at 3 months due to poor weight gain.  She did not consult with a Advertising copywriter.  Mom felt she had a good supply.  Instructed to feed baby with any feeding cue and to call for assist or concerns.  Patient Name: Lori Nevaya Nagele ZOXWR'U Date: 01/09/2015 Reason for consult: Initial assessment   Maternal Data Does the patient have breastfeeding experience prior to this delivery?: Yes  Feeding Feeding Type: Breast Fed Length of feed: 60 min  LATCH Score/Interventions Latch: Grasps breast easily, tongue down, lips flanged, rhythmical sucking.  Audible Swallowing: A few with stimulation  Type of Nipple: Everted at rest and after stimulation  Comfort (Breast/Nipple): Soft / non-tender     Hold (Positioning): No assistance needed to correctly position infant at breast.  LATCH Score: 9  Lactation Tools Discussed/Used     Consult Status Consult Status: Follow-up Date: 01/10/15 Follow-up type: In-patient    Huston Foley 01/09/2015, 10:47 AM

## 2015-01-09 NOTE — Anesthesia Procedure Notes (Signed)
Epidural Patient location during procedure: OB  Staffing Anesthesiologist: Katrinka Herbison, CHRIS Performed by: anesthesiologist   Preanesthetic Checklist Completed: patient identified, surgical consent, pre-op evaluation, timeout performed, IV checked, risks and benefits discussed and monitors and equipment checked  Epidural Patient position: sitting Prep: DuraPrep Patient monitoring: heart rate, cardiac monitor, continuous pulse ox and blood pressure Approach: midline Location: L4-L5 Injection technique: LOR saline  Needle:  Needle type: Tuohy  Needle gauge: 17 G Needle length: 9 cm Needle insertion depth: 6 cm Catheter type: closed end flexible Catheter size: 19 Gauge Catheter at skin depth: 12 cm Test dose: negative and 2% lidocaine with Epi 1:200 K  Assessment Events: blood not aspirated, injection not painful, no injection resistance, negative IV test and no paresthesia  Additional Notes Reason for block:procedure for pain

## 2015-01-09 NOTE — H&P (Signed)
Lori Gentry is a 29 y.o. [phone] female at [redacted]w[redacted]d by 19wk u/s,  presenting in labor. She was seen earlier in night and was 2-3 cm, now  4-5.  Reports active fetal movement, contractions: regular, vaginal bleeding: spotting, membranes: intact. Initiated prenatal care at [facility] at 35 wks, was transfer.   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  H/O term uncomplicated svb x 3  Past Medical History: Past Medical History  Diagnosis Date  . Endometriosis 2011    Dx by lap  . Genital herpes     Past Surgical History: Past Surgical History  Procedure Laterality Date  . Knee surgery  2009  . Laproscopic exam  2011    Obstetrical History: OB History    Gravida Para Term Preterm AB TAB SAB Ectopic Multiple Living   4 3 3  0 0 0 0 0 0 3      Social History: Social History   Social History  . Marital Status: Legally Separated    Spouse Name: N/A  . Number of Children: N/A  . Years of Education: N/A   Social History Main Topics  . Smoking status: Former Smoker    Types: Cigarettes    Quit date: 12/24/2014  . Smokeless tobacco: Never Used  . Alcohol Use: No  . Drug Use: No  . Sexual Activity: Yes    Birth Control/ Protection: None   Other Topics Concern  . Not on file   Social History Narrative    Family History: Family History  Problem Relation Age of Onset  . Hypertension Mother   . Lupus Mother   . Hypertension Maternal Grandmother   . Diabetes Maternal Grandfather   . Diabetes Paternal Grandmother     Allergies: Allergies  Allergen Reactions  . Cefzil [Cefprozil] Anaphylaxis and Swelling    Prescriptions prior to admission  Medication Sig Dispense Refill Last Dose  . acyclovir (ZOVIRAX) 200 MG capsule Take 2 capsules (400 mg total) by mouth 2 (two) times daily. 30 capsule 3 01/08/2015 at Unknown time  . calcium carbonate (TUMS - DOSED IN MG ELEMENTAL CALCIUM) 500 MG chewable tablet Chew 1 tablet by mouth daily as needed for indigestion or heartburn.    Past Week at Unknown time  . metroNIDAZOLE (FLAGYL) 500 MG tablet Take 1 tablet (500 mg total) by mouth 2 (two) times daily. 14 tablet 0   . prenatal vitamin w/FE, FA (PRENATAL 1 + 1) 27-1 MG TABS tablet Take 1 tablet by mouth daily. 30 each 0 01/08/2015 at Unknown time     Review of Systems  Pertinent pos/neg as indicated in HPI    Last menstrual period 03/16/2014, not currently breastfeeding. General appearance: alert, cooperative and mild distress Lungs: clear to auscultation bilaterally Heart: regular rate and rhythm Abdomen: gravid, soft, non-tender Extremities: Tr edema DTR's 2+  Fetal monitoring: FHR: 120 bpm, variability: moderate,  Accelerations: Present,  decelerations:  Absent Uterine activity: regular  Dilation: 4.5 Effacement (%): 90 Station: -1 Exam by:: DCALLAWAY, RN Presentation: cephalic   Prenatal labs: ABO, Rh: A/Positive/-- (03/16 0000) Antibody: Negative (03/16 0000) Rubella:   RPR: Nonreactive (03/16 0000)  HBsAg: Negative (03/16 0000)  HIV: Non-reactive (03/16 0000)  GBS: Negative (07/13 0000)   1 hr Glucola: 97 Genetic screening:  declined Anatomy US: normal  Results for orders placed or performed during the hospital encounter of 01/08/15 (from the past 24 hour(s))  Urinalysis, Routine w reflex microscopic (not at [facility])   Collection Time: 01/08/15  6:33 PM  Result  Value Ref Range   Color, Urine YELLOW YELLOW   APPearance CLEAR CLEAR   Specific Gravity, Urine 1.010 1.005 - 1.030   pH 6.5 5.0 - 8.0   Glucose, UA NEGATIVE NEGATIVE mg/dL   Hgb urine dipstick NEGATIVE NEGATIVE   Bilirubin Urine NEGATIVE NEGATIVE   Ketones, ur NEGATIVE NEGATIVE mg/dL   Protein, ur NEGATIVE NEGATIVE mg/dL   Urobilinogen, UA 1.0 0.0 - 1.0 mg/dL   Nitrite NEGATIVE NEGATIVE   Leukocytes, UA SMALL (A) NEGATIVE  Urine microscopic-add on   Collection Time: 01/08/15  6:33 PM  Result Value Ref Range   Squamous Epithelial / LPF FEW (A) RARE   WBC, UA 3-6 <3 WBC/hpf    RBC / HPF 0-2 <3 RBC/hpf   Bacteria, UA RARE RARE  Wet prep, genital   Collection Time: 01/08/15  7:05 PM  Result Value Ref Range   Yeast Wet Prep HPF POC NONE SEEN NONE SEEN   Trich, Wet Prep NONE SEEN NONE SEEN   Clue Cells Wet Prep HPF POC FEW (A) NONE SEEN   WBC, Wet Prep HPF POC FEW (A) NONE SEEN  Fern Test   Collection Time: 01/08/15  7:14 PM  Result Value Ref Range   POCT Fern Test Negative = intact amniotic membranes      Assessment:  [redacted]w[redacted]d SIUP  G4P3003  Active labor  Cat 1 FHR  GBS Negative (07/13 0000)   HSV2 w/o outbreak, on suppression  Plan:  Admit to BS  IV pain meds/epidural prn active labor  Expectant management  Anticipate NSVB   Plans to breastfeed  Contraception: BTL  Circumcision: n/a  Marge Duncans CNM, WHNP-BC 01/09/2015, 1:42 AM

## 2015-01-10 MED ORDER — IBUPROFEN 600 MG PO TABS: 600.0000 mg | ORAL_TABLET | Freq: Four times a day (QID) | ORAL | Status: DC

## 2015-01-10 NOTE — Lactation Note (Signed)
This note was copied from the chart of Lori Gentry. Lactation Consultation Note  Patient Name: Lori Gentry ZOXWR'U Date: 01/10/2015 Reason for consult: Follow-up assessment  Baby is 30 hours old , 4 % weight loss , and has been consistent with breast. Latch scores 8-9  Feeding 20 -60 mins , Average 20 -40 mins , Voids and stools adequate for age .   hours Bili check 0.6. Per mom right nipple is semi inverted and tender . LC assessed with mom permission and noted the  Areola to be compressible and easily hand expressed . Per mom used breast shells with her 3rd baby ( which helped )  LC also instructed mom on comfort gels , and hand pump , increase flange to #27 if needed.  LC observed mom latching on the left breast , latched with depth, multiply swallows noted . LC flipped upper lip to flanged position  And noted increased swallows. Sore nipple and engorgement prevention and tx reviewed referring to the Baby and me booklet pages 24.  Mother informed of post-discharge support and given phone number to the lactation department, including services for phone call assistance;  out-patient appointments; and breastfeeding support group. List of other breastfeeding resources in the community given in the handout. Encouraged  mother to call for problems or concerns related to breastfeeding.   Maternal Data Has patient been taught Hand Expression?: Yes  Feeding Feeding Type: Breast Fed Length of feed:  (multiply swallows , increased with Breast compressions )  LATCH Score/Interventions Latch: Grasps breast easily, tongue down, lips flanged, rhythmical sucking.  Audible Swallowing: Spontaneous and intermittent  Type of Nipple: Everted at rest and after stimulation  Comfort (Breast/Nipple): Filling, red/small blisters or bruises, mild/mod discomfort  Problem noted: Filling  Hold (Positioning): Assistance needed to correctly position infant at breast and maintain latch.  (fipped uper lip top flanged position ) Intervention(s): Breastfeeding basics reviewed;Support Pillows;Position options;Skin to skin  LATCH Score: 8  Lactation Tools Discussed/Used Tools: Pump;Comfort gels;Flanges Flange Size: 27 Breast pump type: Manual Pump Review: Setup, frequency, and cleaning;Milk Storage Initiated by:: MAI  Date initiated:: 01/10/15   Consult Status Consult Status: Complete Date: 01/10/15 Follow-up type: In-patient    Kathrin Greathouse 01/10/2015, 10:24 AM

## 2015-01-10 NOTE — Discharge Summary (Signed)
Obstetric Discharge Summary Reason for Admission: onset of labor Prenatal Procedures: ultrasound Intrapartum Procedures: spontaneous vaginal delivery Postpartum Procedures: none Complications-Operative and Postpartum: none HEMOGLOBIN  Date Value Ref Range Status  01/09/2015 11.1* 12.0 - 15.0 g/dL Final  21/30/8657 84.6 g/dL Final   HCT  Date Value Ref Range Status  01/09/2015 32.9* 36.0 - 46.0 % Final  08/11/2014 36 % Final    Physical Exam:  General: alert, cooperative, appears stated age and no distress Lochia: appropriate Uterine Fundus: firm Incision: n/a DVT Evaluation: No evidence of DVT seen on physical exam. Negative Homan's sign. No cords or calf tenderness.  Discharge Diagnoses: Term Pregnancy-delivered  Discharge Information: Date: 01/10/2015 Activity: pelvic rest Diet: routine Medications: PNV and Ibuprofen Condition: stable and improved Instructions: refer to practice specific booklet Discharge to: home   Newborn Data: Live born female  Birth Weight: 8 lb 3.2 oz (3720 g) APGAR: 8, 9  Home with mother.  Lori Gentry 01/10/2015, 7:25 AM

## 2015-02-16 ENCOUNTER — Ambulatory Visit: Payer: Medicaid Other | Admitting: Obstetrics & Gynecology

## 2015-03-04 ENCOUNTER — Ambulatory Visit (INDEPENDENT_AMBULATORY_CARE_PROVIDER_SITE_OTHER): Payer: Medicaid Other | Admitting: Medical

## 2015-03-04 ENCOUNTER — Encounter: Payer: Self-pay | Admitting: Medical

## 2015-03-04 DIAGNOSIS — F53 Postpartum depression: Secondary | ICD-10-CM

## 2015-03-04 DIAGNOSIS — B009 Herpesviral infection, unspecified: Secondary | ICD-10-CM | POA: Insufficient documentation

## 2015-03-04 DIAGNOSIS — O99345 Other mental disorders complicating the puerperium: Secondary | ICD-10-CM

## 2015-03-04 NOTE — Progress Notes (Signed)
Patient ID: Lori Gentry, female   DOB: [birth year], 29 y.o.   MRN: [id number] Pt had a PPD score of 19 and was given Emergency Numbers and Social Work Number ICE. Informed pt of policy of having to contact SW and pt stated understanding and would like SW to contact her.   Regarding pts PPD screening score of 19 I called the Clinical Social Worker Jill Side and gave her pt information and MRN number and asked her to contact pt when she got a chance.    Pt started back smoking on 02/16/15 after the DC of BF.

## 2015-03-04 NOTE — Patient Instructions (Signed)
Laparoscopic Tubal Ligation Laparoscopic tubal ligation is a procedure that closes the fallopian tubes at a time other than right after childbirth. When the fallopian tubes are closed, the eggs that are released from the ovaries cannot enter the uterus, and sperm cannot reach the egg. Tubal ligation is also known as getting your "tubes tied." Tubal ligation is done so you will not be able to get pregnant or have a baby. Although this procedure may be undone (reversed), it should be considered permanent and irreversible. If you want to have future pregnancies, you should not have this procedure. LET [facility] CARE PROVIDER KNOW ABOUT:  Any allergies you have.  All medicines you are taking, including vitamins, herbs, eye drops, creams, and over-the-counter medicines. This includes any use of steroids, either by mouth or in cream form.  Previous problems you or members of your family have had with the use of anesthetics.  Any blood disorders you have.  Previous surgeries you have had.  Any medical conditions you may have.  Possibility of pregnancy, if this applies.  Any past pregnancies. RISKS AND COMPLICATIONS  Infection.  Bleeding.  Injury to surrounding organs.  Side effects from anesthetics.  Failure of the procedure.  Ectopic pregnancy.  Future regret about having the procedure done. BEFORE THE PROCEDURE  Ask your health care provider about:  Changing or stopping your regular medicines. This is especially important if you are taking diabetes medicines or blood thinners.  Taking medicines such as aspirin and ibuprofen. These medicines can thin your blood. Do not take these medicines before your procedure if your health care provider instructs you not to.  Follow instructions from your health care provider about eating and drinking restrictions.  Plan to have someone take you home after the procedure.  If you go home right after the procedure, plan to have someone  with you for 24 hours. PROCEDURE  You will be given one or more of the following:  A medicine that helps you relax (sedative).  A medicine that numbs the area (local anesthetic).  A medicine that makes you fall asleep (general anesthetic).  A medicine that is injected into an area of your body that numbs everything below the injection site (regional anesthetic).  If you have been given general anesthetic, a tube will be put down your throat to help you breathe.  Two small cuts (incisions) will be made in the lower abdominal area and near the belly button.  Your bladder may be emptied with a small tube (catheter).  Your abdomen will be inflated with a safe gas (carbon dioxide). This will help to give the surgeon room to operate and visualize, and it will help the surgeon to avoid other organs.  A thin, lighted tube (laparoscope) with a camera attached will be inserted into your abdomen through one of the incisions near the belly button. Other small instruments will be inserted through the other abdominal incision.  The fallopian tubes will be tied off or burned (cauterized), or they will be blocked with a clip, ring, or clamp. In many cases, a small portion in the center of each fallopian tube will also be removed.  After the fallopian tubes are blocked, the gas will be released from the abdomen.  The incisions will be closed with stitches (sutures).  A bandage (dressing) will be placed over the incisions. The procedure may vary among health care providers and hospitals. AFTER THE PROCEDURE  Your blood pressure, heart rate, breathing rate, and blood oxygen level  will be monitored often until the medicines you were given have worn off.  You will be given pain medicine as needed.  If you had general anesthetic, you may have some mild discomfort in your throat. This is from the breathing tube that was placed in your throat while you were sleeping.  You may experience discomfort in  the shoulder area from some trapped air between your liver and your diaphragm. This sensation is normal, and it will slowly go away on its own.  You will have some mild abdominal discomfort for 3--7 days.   This information is not intended to replace advice given to you by your health care provider. Make sure you discuss any questions you have with your health care provider.   Document Released: 08/20/2000 Document Revised: 09/28/2014 Document Reviewed: 08/25/2011 Elsevier Interactive Patient Education 2016 [employer] Bipolar Disorder Bipolar disorder is a mental illness. The term bipolar disorder actually is used to describe a group of disorders that all share varying degrees of emotional highs and lows that can interfere with daily functioning, such as work, school, or relationships. Bipolar disorder also can lead to drug abuse, hospitalization, and suicide. The emotional highs of bipolar disorder are periods of elation or irritability and high energy. These highs can range from a mild form (hypomania) to a severe form (mania). People experiencing episodes of hypomania may appear energetic, excitable, and highly productive. People experiencing mania may behave impulsively or erratically. They often make poor decisions. They may have difficulty sleeping. The most severe episodes of mania can involve having very distorted beliefs or perceptions about the world and seeing or hearing things that are not real (psychotic delusions and hallucinations).  The emotional lows of bipolar disorder (depression) also can range from mild to severe. Severe episodes of bipolar depression can involve psychotic delusions and hallucinations. Sometimes people with bipolar disorder experience a state of mixed mood. Symptoms of hypomania or mania and depression are both present during this mixed-mood episode. SIGNS AND SYMPTOMS There are signs and symptoms of the episodes of hypomania and mania as well as the episodes  of depression. The signs and symptoms of hypomania and mania are similar but vary in severity. They include:  Inflated self-esteem or feeling of increased self-confidence.  Decreased need for sleep.  Unusual talkativeness (rapid or pressured speech) or the feeling of a need to keep talking.  Sensation of racing thoughts or constant talking, with quick shifts between topics that may or may not be related (flight of ideas).  Decreased ability to focus or concentrate.  Increased purposeful activity, such as work, studies, or social activity, or nonproductive activity, such as pacing, squirming and fidgeting, or finger and toe tapping.  Impulsive behavior and use of poor judgment, resulting in high-risk activities, such as having unprotected sex or spending excessive amounts of money. Signs and symptoms of depression include the following:   Feelings of sadness, hopelessness, or helplessness.  Frequent or uncontrollable episodes of crying.  Lack of feeling anything or caring about anything.  Difficulty sleeping or sleeping too much.  Inability to enjoy the things you used to enjoy.   Desire to be alone all the time.   Feelings of guilt or worthlessness.  Lack of energy or motivation.   Difficulty concentrating, remembering, or making decisions.  Change in appetite or weight beyond normal fluctuations.  Thoughts of death or the desire to harm yourself. DIAGNOSIS  Bipolar disorder is diagnosed through an assessment by your caregiver. Your caregiver will ask  questions about your emotional episodes. There are two main types of bipolar disorder. People with type I bipolar disorder have manic episodes with or without depressive episodes. People with type II bipolar disorder have hypomanic episodes and major depressive episodes, which are more serious than mild depression. The type of bipolar disorder you have can make an important difference in how your illness is monitored and  treated. Your caregiver may ask questions about your medical history and use of alcohol or drugs, including prescription medication. Certain medical conditions and substances also can cause emotional highs and lows that resemble bipolar disorder (secondary bipolar disorder).  TREATMENT  Bipolar disorder is a long-term illness. It is best controlled with continuous treatment rather than treatment only when symptoms occur. The following treatments can be prescribed for bipolar disorders:  Medication--Medication can be prescribed by a doctor that is an expert in treating mental disorders (psychiatrists). Medications called mood stabilizers are usually prescribed to help control the illness. Other medications are sometimes added if symptoms of mania, depression, or psychotic delusions and hallucinations occur despite the use of a mood stabilizer.  Talk therapy--Some forms of talk therapy are helpful in providing support, education, and guidance. A combination of medication and talk therapy is best for managing the disorder over time. A procedure in which electricity is applied to your brain through your scalp (electroconvulsive therapy) is used in cases of severe mania when medication and talk therapy do not work or work too slowly.   This information is not intended to replace advice given to you by your health care provider. Make sure you discuss any questions you have with your health care provider.   Document Released: 08/20/2000 Document Revised: 06/04/2014 Document Reviewed: 06/09/2012 Elsevier Interactive Patient Education [employer]

## 2015-03-04 NOTE — Progress Notes (Signed)
Patient ID: Cana Mignano, female   DOB: 1985/11/12, 29 y.o.   MRN: [id number] Subjective:     Damyia Strider is a 29 y.o. female who presents for a postpartum visit. She is 7 weeks postpartum following a spontaneous vaginal delivery. I have fully reviewed the prenatal and intrapartum course. The delivery was at 40.3 gestational weeks. Outcome: spontaneous vaginal delivery. Anesthesia: epidural. Postpartum course has been complicated by depression and anxiety. Baby's course has been normal. Baby is feeding by bottle - Similac Advance. Bleeding no bleeding. Bowel function is normal. Bladder function is normal. Patient is not sexually active. Contraception method is none. Postpartum depression screening: positive. Social work has been advised to follow-up with patient by phone. Contact information given for 24 hour help line. Patient denies SI or HI today.   The following portions of the patient's history were reviewed and updated as appropriate: allergies, current medications, past family history, past medical history, past social history, past surgical history and problem list.  Review of Systems Pertinent items are noted in HPI.   Objective:    BP 133/86 mmHg  Pulse 82  Temp(Src) 98.7 F (37.1 C) (Oral)  Resp 18  Wt 152 lb (68.947 kg)  SpO2 100%  Breastfeeding? No  General:  alert and cooperative   Breasts:  not performed  Lungs: clear to auscultation bilaterally  Heart:  regular rate and rhythm, S1, S2 normal, no murmur, click, rub or gallop  Abdomen: soft, non-tender; bowel sounds normal; no masses,  no organomegaly   Vulva:  not evaluated  Vagina: not evaluated  Cervix:  not evaluated  Corpus: not examined  Adnexa:  not evaluated  Rectal Exam: Not performed.        Assessment:     Postpartum exam. Pap smear not done at today's visit.  Last pap smear 11/2014 was normal Bipolar disorder, untreated for many years Depression  Plan:    1. Contraception: tubal ligation Tubal papers  signed during pregnancy are still active. Message sent to Surgical Scheduling. They will call with an appointment date/time for BTL 2. Follow-up with PCP at [facility] for Primary care and management of Bipolar, Depression and Anxiety 3. Follow up in: 1 year for annual exam or sooner as needed.

## 2015-03-04 NOTE — Progress Notes (Signed)
CSW received message from Clinic staff reporting patient's score of 19 on the [place] PPD screen.  CSW attempted to call patient, who did not answer.  CSW left message requesting a call back at patient's convenience.

## 2015-03-18 NOTE — Patient Instructions (Signed)
Your procedure is scheduled on:  Tuesday, March 22, 2015  Enter through the Main Entrance of [facility] at:  6:00 A.M.  Pick up the phone at the desk and dial [phone].  Call this number if you have problems the morning of surgery: [phone].  Remember: Do NOT eat food or drink after: MIDNIGHT TONIGHT Take these medicines the morning of surgery with a SIP OF WATER: NONE  Do NOT wear jewelry (body piercing), metal hair clips/bobby pins, make-up, or nail polish. Do NOT wear lotions, powders, or perfumes.  You may wear deoderant. Do NOT shave for 48 hours prior to surgery. Do NOT bring valuables to the hospital. Contacts, dentures, or bridgework may not be worn into surgery.  Have a responsible adult drive you home and stay with you for 24 hours after your procedure

## 2015-03-21 ENCOUNTER — Encounter (HOSPITAL_COMMUNITY): Payer: Self-pay

## 2015-03-21 ENCOUNTER — Encounter (HOSPITAL_COMMUNITY)
Admission: RE | Admit: 2015-03-21 | Discharge: 2015-03-21 | Disposition: A | Discharge status: Home / Self Care | Payer: Medicaid Other | Source: Ambulatory Visit | Attending: Obstetrics and Gynecology | Admitting: Obstetrics and Gynecology

## 2015-03-21 DIAGNOSIS — Z01818 Encounter for other preprocedural examination: Secondary | ICD-10-CM | POA: Insufficient documentation

## 2015-03-21 LAB — CBC
HCT: 36.2 % (ref 36.0–46.0)
HEMOGLOBIN: 12.2 g/dL (ref 12.0–15.0)
MCH: 30.5 pg (ref 26.0–34.0)
MCHC: 33.7 g/dL (ref 30.0–36.0)
MCV: 90.5 fL (ref 78.0–100.0)
Platelets: 295 10*3/uL (ref 150–400)
RBC: 4 MIL/uL (ref 3.87–5.11)
RDW: 13 % (ref 11.5–15.5)
WBC: 6.5 10*3/uL (ref 4.0–10.5)

## 2015-03-21 NOTE — Anesthesia Preprocedure Evaluation (Addendum)
Anesthesia Evaluation  Patient identified by MRN, date of birth, ID band Patient awake    Reviewed: Allergy & Precautions, NPO status , Patient's Chart, lab work & pertinent test results  History of Anesthesia Complications Negative for: history of anesthetic complications  Airway Mallampati: II  TM Distance: >3 FB Neck ROM: Full    Dental no notable dental hx. (+) Dental Advisory Given   Pulmonary Current Smoker,    Pulmonary exam normal breath sounds clear to auscultation       Cardiovascular negative cardio ROS Normal cardiovascular exam Rhythm:Regular Rate:Normal     Neuro/Psych negative neurological ROS  negative psych ROS   GI/Hepatic negative GI ROS, Neg liver ROS,   Endo/Other  negative endocrine ROS  Renal/GU negative Renal ROS  negative genitourinary   Musculoskeletal negative musculoskeletal ROS (+)   Abdominal   Peds negative pediatric ROS (+)  Hematology negative hematology ROS (+)   Anesthesia Other Findings   Reproductive/Obstetrics negative OB ROS                             Anesthesia Physical Anesthesia Plan  ASA: II  Anesthesia Plan: General   Post-op Pain Management:    Induction: Intravenous  Airway Management Planned: Oral ETT  Additional Equipment:   Intra-op Plan:   Post-operative Plan: Extubation in OR  Informed Consent: I have reviewed the patients History and Physical, chart, labs and discussed the procedure including the risks, benefits and alternatives for the proposed anesthesia with the patient or authorized representative who has indicated his/her understanding and acceptance.   Dental advisory given  Plan Discussed with: CRNA  Anesthesia Plan Comments:         Anesthesia Quick Evaluation  

## 2015-03-21 NOTE — Op Note (Signed)
Lori BarsWhitney Gentry 03/22/2015  PREOPERATIVE DIAGNOSIS:  Undesired fertility  POSTOPERATIVE DIAGNOSIS:  Undesired fertility  PROCEDURE:  Laparoscopic Bilateral Tubal Sterilization using Filshie Clips   SURGEON: Dr. Catalina AntiguaPeggy Ginni Eichler  ANESTHESIA:  General endotracheal  COMPLICATIONS:  None immediate.  ESTIMATED BLOOD LOSS:  25 ml.  FLUIDS: 800 ml LR.  URINE OUTPUT:  200 ml of clear urine.  INDICATIONS: 29 y.o. [id number]  with undesired fertility, desires permanent sterilization. Other reversible forms of contraception were discussed with patient; she declines all other modalities.  Risks of procedure discussed with patient including permanence of method, bleeding, infection, injury to surrounding organs and need for additional procedures including laparotomy, risk of regret.  Failure risk of 0.5-1% with increased risk of ectopic gestation if pregnancy occurs was also discussed with patient.      FINDINGS:  Normal uterus, tubes, and ovaries.  TECHNIQUE:  The patient was taken to the operating room where general anesthesia was obtained without difficulty.  She was then placed in the dorsal lithotomy position and prepared and draped in sterile fashion.  After an adequate timeout was performed, a bivalved speculum was then placed in the patient's vagina, and the anterior lip of cervix grasped with the single-tooth tenaculum.  The uterine manipulator was then advanced into the uterus.  The speculum was removed from the vagina.  Attention was then turned to the patient's abdomen where a 10-mm skin incision was made on the umbilical fold.  The underlying fascia was identified, grasped with Kocher clamps, tented up and entered sharply with mayo scissors. The fascia was tagged with 0-Vicryl. The peritoneum was entered bluntly.The 11 mm trocar and sleeve were introduced into the abdominal cavityl.  Intraperitoneal placement was confirmed with the use of the laparoscope. Pneumoperitoneum was achieved by the  insufflation of CO2 gas. A survey of the patient's pelvis and abdomen revealed entirely normal anatomy.  The fallopian tubes were observed and found to be normal in appearance. The Filshie clip applicator was placed through the operative port, and a Filshie clip was placed on the right fallopian tube ,about 2 cm from the cornual attachment, with care given to incorporate the underlying mesosalpinx.  A similar process was carried out on the contralateral side allowing for bilateral tubal sterilization.   Good hemostasis was noted overall. The instruments were then removed from the patient's abdomen and the fascial incision was repaired with 0 Vicryl, and the skin was closed with 4-0 Vicryl. The uterine manipulator and the tenaculum were removed from the vagina without complications. The patient tolerated the procedure well.  Sponge, lap, and needle counts were correct times two.  The patient was then taken to the recovery room awake, extubated and in stable  in stable condition.

## 2015-03-21 NOTE — H&P (Signed)
Lori BarsWhitney Gentry is an 29 y.o. female G4P4 presenting today for scheduled bilateral tubal ligation. Patient is currently without complaints  Pertinent Gynecological History: Menses: regular every month without intermenstrual spotting Contraception: none DES exposure: denies Blood transfusions: none Sexually transmitted diseases: no past history Previous GYN Procedures: LSC for endometriosis  Last mammogram: n/a  Last pap: normal Date: 11/2014 OB History: G4, P4004   Menstrual History: Patient's last menstrual period was 03/15/2015.    Past Medical History  Diagnosis Date  . Endometriosis 2011    Dx by lap  . Genital herpes     Past Surgical History  Procedure Laterality Date  . Knee surgery  2009  . Laproscopic exam  2011    Family History  Problem Relation Age of Onset  . Hypertension Mother   . Lupus Mother   . Hypertension Maternal Grandmother   . Diabetes Maternal Grandfather   . Diabetes Paternal Grandmother     Social History:  reports that she has been smoking Cigarettes.  She has a 5 pack-year smoking history. She has never used smokeless tobacco. She reports that she does not drink alcohol or use illicit drugs.  Allergies:  Allergies  Allergen Reactions  . Cefzil [Cefprozil] Anaphylaxis and Swelling    Prescriptions prior to admission  Medication Sig Dispense Refill Last Dose  . acetaminophen (TYLENOL) 500 MG tablet Take 1,000 mg by mouth every 6 (six) hours as needed for headache.   Past Week at Unknown time    ROS See pertinent in HPI Blood pressure 125/85, pulse 71, temperature 97.9 F (36.6 C), temperature source Oral, resp. rate 18, last menstrual period 03/15/2015, SpO2 100 %, not currently breastfeeding. Physical Exam  GENERAL: Well-developed, well-nourished female in no acute distress.  HEENT: Normocephalic, atraumatic. Sclerae anicteric.  NECK: Supple. Normal thyroid.  LUNGS: Clear to auscultation bilaterally.  HEART: Regular rate and  rhythm. ABDOMEN: Soft, nontender, nondistended. No organomegaly. PELVIC: Deferred to OR. EXTREMITIES: No cyanosis, clubbing, or edema, 2+ distal pulses.   Results for orders placed or performed during the hospital encounter of 03/22/15 (from the past 24 hour(s))  Pregnancy, urine     Status: None   Collection Time: 03/22/15  6:00 AM  Result Value Ref Range   Preg Test, Ur NEGATIVE NEGATIVE    No results found.  Assessment/Plan: 29 yo G4P4 here for scheduled bilateral tubal ligation  Risks and benefits of procedure discussed with patient including permanence of method, bleeding, infection, injury to surrounding organs and need for additional procedures. Risk failure of 0.5-1% with increased risk of ectopic gestation if pregnancy occurs was also discussed with patient.    Pedram Goodchild 03/22/2015, 7:08 AM

## 2015-03-22 ENCOUNTER — Ambulatory Visit (HOSPITAL_COMMUNITY): Payer: Medicaid Other | Admitting: Anesthesiology

## 2015-03-22 ENCOUNTER — Ambulatory Visit (HOSPITAL_COMMUNITY)
Admission: RE | Admit: 2015-03-22 | Discharge: 2015-03-22 | Disposition: A | Discharge status: Home / Self Care | Payer: Medicaid Other | Source: Ambulatory Visit | Attending: Obstetrics and Gynecology | Admitting: Obstetrics and Gynecology

## 2015-03-22 ENCOUNTER — Encounter (HOSPITAL_COMMUNITY)
Admission: RE | Disposition: A | Discharge status: Home / Self Care | Payer: Self-pay | Source: Ambulatory Visit | Attending: Obstetrics and Gynecology

## 2015-03-22 ENCOUNTER — Encounter (HOSPITAL_COMMUNITY): Payer: Self-pay

## 2015-03-22 DIAGNOSIS — F17210 Nicotine dependence, cigarettes, uncomplicated: Secondary | ICD-10-CM | POA: Diagnosis not present

## 2015-03-22 DIAGNOSIS — Z302 Encounter for sterilization: Secondary | ICD-10-CM | POA: Insufficient documentation

## 2015-03-22 DIAGNOSIS — Z888 Allergy status to other drugs, medicaments and biological substances status: Secondary | ICD-10-CM | POA: Diagnosis not present

## 2015-03-22 HISTORY — PX: LAPAROSCOPIC TUBAL LIGATION: SHX1937

## 2015-03-22 LAB — PREGNANCY, URINE: PREG TEST UR: NEGATIVE

## 2015-03-22 SURGERY — LIGATION, FALLOPIAN TUBE, LAPAROSCOPIC
Anesthesia: General | Site: Abdomen | Laterality: Bilateral

## 2015-03-22 MED ORDER — LIDOCAINE HCL (CARDIAC) 20 MG/ML IV SOLN
INTRAVENOUS | Status: AC
  Filled 2015-03-22: qty 5

## 2015-03-22 MED ORDER — FENTANYL CITRATE (PF) 100 MCG/2ML IJ SOLN
25.0000 ug | INTRAMUSCULAR | Status: DC | PRN
  Administered 2015-03-22 (×3): 25 ug via INTRAVENOUS

## 2015-03-22 MED ORDER — DEXAMETHASONE SODIUM PHOSPHATE 4 MG/ML IJ SOLN
INTRAMUSCULAR | Status: AC
  Filled 2015-03-22: qty 1

## 2015-03-22 MED ORDER — FENTANYL CITRATE (PF) 100 MCG/2ML IJ SOLN
INTRAMUSCULAR | Status: AC
  Administered 2015-03-22: 25 ug via INTRAVENOUS
  Filled 2015-03-22: qty 2

## 2015-03-22 MED ORDER — LACTATED RINGERS IV SOLN
INTRAVENOUS | Status: DC
  Administered 2015-03-22: 125 mL/h via INTRAVENOUS
  Administered 2015-03-22 (×2): via INTRAVENOUS

## 2015-03-22 MED ORDER — BUPIVACAINE HCL (PF) 0.25 % IJ SOLN
INTRAMUSCULAR | Status: DC | PRN
  Administered 2015-03-22: 10 mL

## 2015-03-22 MED ORDER — SUCCINYLCHOLINE CHLORIDE 20 MG/ML IJ SOLN
INTRAMUSCULAR | Status: DC | PRN
  Administered 2015-03-22: 100 mg via INTRAVENOUS

## 2015-03-22 MED ORDER — FENTANYL CITRATE (PF) 100 MCG/2ML IJ SOLN
INTRAMUSCULAR | Status: AC
  Filled 2015-03-22: qty 4

## 2015-03-22 MED ORDER — SCOPOLAMINE 1 MG/3DAYS TD PT72
1.0000 | MEDICATED_PATCH | Freq: Once | TRANSDERMAL | Status: DC
  Administered 2015-03-22: 1.5 mg via TRANSDERMAL

## 2015-03-22 MED ORDER — KETOROLAC TROMETHAMINE 30 MG/ML IJ SOLN
INTRAMUSCULAR | Status: DC | PRN
  Administered 2015-03-22: 30 mg via INTRAVENOUS

## 2015-03-22 MED ORDER — ROCURONIUM BROMIDE 100 MG/10ML IV SOLN
INTRAVENOUS | Status: DC | PRN
  Administered 2015-03-22: 25 mg via INTRAVENOUS
  Administered 2015-03-22: 5 mg via INTRAVENOUS

## 2015-03-22 MED ORDER — OXYCODONE-ACETAMINOPHEN 5-325 MG PO TABS: 1.0000 | ORAL_TABLET | ORAL | Status: DC | PRN

## 2015-03-22 MED ORDER — GLYCOPYRROLATE 0.2 MG/ML IJ SOLN
INTRAMUSCULAR | Status: DC | PRN
  Administered 2015-03-22: 0.4 mg via INTRAVENOUS
  Administered 2015-03-22: 0.2 mg via INTRAVENOUS

## 2015-03-22 MED ORDER — ONDANSETRON HCL 4 MG/2ML IJ SOLN
INTRAMUSCULAR | Status: DC | PRN
  Administered 2015-03-22: 4 mg via INTRAVENOUS

## 2015-03-22 MED ORDER — BUPIVACAINE HCL (PF) 0.25 % IJ SOLN
INTRAMUSCULAR | Status: AC
  Filled 2015-03-22: qty 30

## 2015-03-22 MED ORDER — PROPOFOL 10 MG/ML IV BOLUS
INTRAVENOUS | Status: AC
  Filled 2015-03-22: qty 20

## 2015-03-22 MED ORDER — ONDANSETRON HCL 4 MG/2ML IJ SOLN
INTRAMUSCULAR | Status: AC
  Filled 2015-03-22: qty 2

## 2015-03-22 MED ORDER — SUCCINYLCHOLINE CHLORIDE 20 MG/ML IJ SOLN
INTRAMUSCULAR | Status: AC
  Filled 2015-03-22: qty 1

## 2015-03-22 MED ORDER — PROPOFOL 10 MG/ML IV BOLUS
INTRAVENOUS | Status: DC | PRN
  Administered 2015-03-22: 180 mg via INTRAVENOUS

## 2015-03-22 MED ORDER — MIDAZOLAM HCL 2 MG/2ML IJ SOLN
INTRAMUSCULAR | Status: AC
  Filled 2015-03-22: qty 4

## 2015-03-22 MED ORDER — FENTANYL CITRATE (PF) 100 MCG/2ML IJ SOLN
INTRAMUSCULAR | Status: DC | PRN
  Administered 2015-03-22 (×4): 50 ug via INTRAVENOUS

## 2015-03-22 MED ORDER — SCOPOLAMINE 1 MG/3DAYS TD PT72
MEDICATED_PATCH | TRANSDERMAL | Status: AC
  Filled 2015-03-22: qty 1

## 2015-03-22 MED ORDER — IBUPROFEN 600 MG PO TABS: 600.0000 mg | ORAL_TABLET | Freq: Four times a day (QID) | ORAL | Status: DC | PRN

## 2015-03-22 MED ORDER — NEOSTIGMINE METHYLSULFATE 10 MG/10ML IV SOLN
INTRAVENOUS | Status: DC | PRN
Start: 2015-03-22 — End: 2015-03-22
  Administered 2015-03-22: 1 mg via INTRAVENOUS
  Administered 2015-03-22: 2.5 mg via INTRAVENOUS

## 2015-03-22 MED ORDER — ONDANSETRON HCL 4 MG/2ML IJ SOLN: 4.0000 mg | Freq: Once | INTRAMUSCULAR | Status: DC | PRN

## 2015-03-22 MED ORDER — MIDAZOLAM HCL 2 MG/2ML IJ SOLN
INTRAMUSCULAR | Status: DC | PRN
  Administered 2015-03-22: 2 mg via INTRAVENOUS

## 2015-03-22 MED ORDER — NEOSTIGMINE METHYLSULFATE 10 MG/10ML IV SOLN
INTRAVENOUS | Status: AC
  Filled 2015-03-22: qty 1

## 2015-03-22 MED ORDER — LIDOCAINE HCL (CARDIAC) 20 MG/ML IV SOLN
INTRAVENOUS | Status: DC | PRN
  Administered 2015-03-22: 30 mg via INTRAVENOUS

## 2015-03-22 MED ORDER — DEXAMETHASONE SODIUM PHOSPHATE 10 MG/ML IJ SOLN
INTRAMUSCULAR | Status: DC | PRN
  Administered 2015-03-22: 4 mg via INTRAVENOUS

## 2015-03-22 MED ORDER — GLYCOPYRROLATE 0.2 MG/ML IJ SOLN
INTRAMUSCULAR | Status: AC
  Filled 2015-03-22: qty 4

## 2015-03-22 SURGICAL SUPPLY — 21 items
CHLORAPREP W/TINT 26ML (MISCELLANEOUS) ×3 IMPLANT
CLIP FILSHIE TUBAL LIGA STRL (Clip) ×6 IMPLANT
DRSG COVADERM PLUS 2X2 (GAUZE/BANDAGES/DRESSINGS) ×6 IMPLANT
DRSG OPSITE POSTOP 3X4 (GAUZE/BANDAGES/DRESSINGS) ×3 IMPLANT
ELECT REM PT RETURN 9FT ADLT (ELECTROSURGICAL) ×3
ELECTRODE REM PT RTRN 9FT ADLT (ELECTROSURGICAL) ×1 IMPLANT
GLOVE BIOGEL PI IND STRL 6.5 (GLOVE) ×1 IMPLANT
GLOVE BIOGEL PI INDICATOR 6.5 (GLOVE) ×2
GLOVE SURG SS PI 6.0 STRL IVOR (GLOVE) ×3 IMPLANT
GOWN STRL REUS W/TWL LRG LVL3 (GOWN DISPOSABLE) ×6 IMPLANT
NS IRRIG 1000ML POUR BTL (IV SOLUTION) ×3 IMPLANT
PACK LAPAROSCOPY BASIN (CUSTOM PROCEDURE TRAY) ×3 IMPLANT
PAD POSITIONING PINK XL (MISCELLANEOUS) ×3 IMPLANT
SUT MON AB 4-0 PS1 27 (SUTURE) ×3 IMPLANT
SUT VICRYL 0 UR6 27IN ABS (SUTURE) ×3 IMPLANT
TOWEL OR 17X24 6PK STRL BLUE (TOWEL DISPOSABLE) ×6 IMPLANT
TRAY FOLEY BAG SILVER LF 16FR (SET/KITS/TRAYS/PACK) ×3 IMPLANT
TROCAR BALLN 12MMX100 BLUNT (TROCAR) ×3 IMPLANT
TROCAR XCEL NON-BLD 11X100MML (ENDOMECHANICALS) IMPLANT
TROCAR XCEL NON-BLD 5MMX100MML (ENDOMECHANICALS) IMPLANT
WATER STERILE IRR 1000ML POUR (IV SOLUTION) ×3 IMPLANT

## 2015-03-22 NOTE — Anesthesia Postprocedure Evaluation (Signed)
  Anesthesia Post-op Note  Patient: Lori Gentry  Procedure(s) Performed: Procedure(s) (LRB): LAPAROSCOPIC TUBAL LIGATION (Bilateral)  Patient Location: PACU  Anesthesia Type: General  Level of Consciousness: awake and alert   Airway and Oxygen Therapy: Patient Spontanous Breathing  Post-op Pain: mild  Post-op Assessment: Post-op Vital signs reviewed, Patient's Cardiovascular Status Stable, Respiratory Function Stable, Patent Airway and No signs of Nausea or vomiting  Last Vitals:  Filed Vitals:   03/22/15 0830  BP: 149/109  Pulse: 64  Temp:   Resp: 11    Post-op Vital Signs: stable   Complications: No apparent anesthesia complications

## 2015-03-22 NOTE — Transfer of Care (Signed)
Immediate Anesthesia Transfer of Care Note  Patient: Lori Gentry  Procedure(s) Performed: Procedure(s) with comments: LAPAROSCOPIC TUBAL LIGATION (Bilateral) - Requested 03/22/15 @ 7:30a  Patient Location: PACU  Anesthesia Type:General  Level of Consciousness: awake  Airway & Oxygen Therapy: Patient Spontanous Breathing and Patient connected to nasal cannula oxygen  Post-op Assessment: Report given to RN and Post -op Vital signs reviewed and stable  Post vital signs: Reviewed and stable  Last Vitals:  Filed Vitals:   03/22/15 0601  BP: 125/85  Pulse: 71  Temp: 36.6 C  Resp: 18    Complications: No apparent anesthesia complications

## 2015-03-22 NOTE — Anesthesia Procedure Notes (Signed)
Procedure Name: Intubation Date/Time: 03/22/2015 7:27 AM Performed by: Tearah Saulsbury G Pre-anesthesia Checklist: Patient identified, Timeout performed, Emergency Drugs available, Suction available and Patient being monitored Patient Re-evaluated:Patient Re-evaluated prior to inductionOxygen Delivery Method: Circle system utilized Preoxygenation: Pre-oxygenation with 100% oxygen Intubation Type: IV induction Ventilation: Mask ventilation without difficulty Laryngoscope Size: Mac and 3 Grade View: Grade I Tube type: Oral Number of attempts: 1 Airway Equipment and Method: Stylet Placement Confirmation: ETT inserted through vocal cords under direct vision,  positive ETCO2 and breath sounds checked- equal and bilateral Secured at: 21 cm Tube secured with: Tape Dental Injury: Teeth and Oropharynx as per pre-operative assessment

## 2015-03-24 ENCOUNTER — Encounter (HOSPITAL_COMMUNITY): Payer: Self-pay | Admitting: Obstetrics and Gynecology

## 2015-04-07 ENCOUNTER — Ambulatory Visit: Payer: Medicaid Other | Admitting: Obstetrics and Gynecology

## 2015-04-08 ENCOUNTER — Encounter: Payer: Self-pay | Admitting: *Deleted

## 2018-11-22 ENCOUNTER — Emergency Department (HOSPITAL_COMMUNITY): Payer: Medicaid Other

## 2018-11-22 ENCOUNTER — Other Ambulatory Visit: Payer: Self-pay

## 2018-11-22 ENCOUNTER — Encounter (HOSPITAL_COMMUNITY): Payer: Self-pay | Admitting: Emergency Medicine

## 2018-11-22 ENCOUNTER — Emergency Department (HOSPITAL_COMMUNITY)
Admission: EM | Admit: 2018-11-22 | Discharge: 2018-11-22 | Disposition: A | Discharge status: Home / Self Care | Payer: Medicaid Other | Attending: Emergency Medicine | Admitting: Emergency Medicine

## 2018-11-22 DIAGNOSIS — F17210 Nicotine dependence, cigarettes, uncomplicated: Secondary | ICD-10-CM | POA: Diagnosis not present

## 2018-11-22 DIAGNOSIS — R1011 Right upper quadrant pain: Secondary | ICD-10-CM

## 2018-11-22 DIAGNOSIS — R1010 Upper abdominal pain, unspecified: Secondary | ICD-10-CM

## 2018-11-22 LAB — CBC
HCT: 35 % — ABNORMAL LOW (ref 36.0–46.0)
Hemoglobin: 12 g/dL (ref 12.0–15.0)
MCH: 31.2 pg (ref 26.0–34.0)
MCHC: 34.3 g/dL (ref 30.0–36.0)
MCV: 90.9 fL (ref 80.0–100.0)
Platelets: 354 10*3/uL (ref 150–400)
RBC: 3.85 MIL/uL — ABNORMAL LOW (ref 3.87–5.11)
RDW: 11.9 % (ref 11.5–15.5)
WBC: 7.4 10*3/uL (ref 4.0–10.5)
nRBC: 0 % (ref 0.0–0.2)

## 2018-11-22 LAB — URINALYSIS, ROUTINE W REFLEX MICROSCOPIC
Bilirubin Urine: NEGATIVE
Glucose, UA: NEGATIVE mg/dL
Hgb urine dipstick: NEGATIVE
Ketones, ur: NEGATIVE mg/dL
Leukocytes,Ua: NEGATIVE
Nitrite: NEGATIVE
Protein, ur: NEGATIVE mg/dL
Specific Gravity, Urine: 1.015 (ref 1.005–1.030)
pH: 6 (ref 5.0–8.0)

## 2018-11-22 LAB — COMPREHENSIVE METABOLIC PANEL
ALT: 15 U/L (ref 0–44)
AST: 19 U/L (ref 15–41)
Albumin: 4.1 g/dL (ref 3.5–5.0)
Alkaline Phosphatase: 73 U/L (ref 38–126)
Anion gap: 9 (ref 5–15)
BUN: 11 mg/dL (ref 6–20)
CO2: 20 mmol/L — ABNORMAL LOW (ref 22–32)
Calcium: 9.3 mg/dL (ref 8.9–10.3)
Chloride: 106 mmol/L (ref 98–111)
Creatinine, Ser: 0.93 mg/dL (ref 0.44–1.00)
GFR calc Af Amer: >60 mL/min (ref >60)
GFR calc non Af Amer: >60 mL/min (ref >60)
Glucose, Bld: 94 mg/dL (ref 70–99)
Potassium: 3.4 mmol/L — ABNORMAL LOW (ref 3.5–5.1)
Sodium: 135 mmol/L (ref 135–145)
Total Bilirubin: 0.4 mg/dL (ref 0.3–1.2)
Total Protein: 6.7 g/dL (ref 6.5–8.1)

## 2018-11-22 LAB — LIPASE, BLOOD: Lipase: 31 U/L (ref 11–51)

## 2018-11-22 MED ORDER — FENTANYL CITRATE (PF) 100 MCG/2ML IJ SOLN
50.0000 ug | INTRAMUSCULAR | Status: DC | PRN
  Administered 2018-11-22: 50 ug via INTRAVENOUS
  Filled 2018-11-22: qty 2

## 2018-11-22 MED ORDER — MORPHINE SULFATE (PF) 4 MG/ML IV SOLN
4.0000 mg | Freq: Once | INTRAVENOUS | Status: AC
  Administered 2018-11-22: 4 mg via INTRAVENOUS
  Filled 2018-11-22: qty 1

## 2018-11-22 MED ORDER — ONDANSETRON HCL 4 MG/2ML IJ SOLN
4.0000 mg | Freq: Once | INTRAMUSCULAR | Status: AC
  Administered 2018-11-22: 4 mg via INTRAVENOUS
  Filled 2018-11-22: qty 2

## 2018-11-22 MED ORDER — ONDANSETRON HCL 4 MG/2ML IJ SOLN
4.0000 mg | Freq: Once | INTRAMUSCULAR | Status: AC | PRN
  Administered 2018-11-22: 4 mg via INTRAVENOUS
  Filled 2018-11-22: qty 2

## 2018-11-22 MED ORDER — ONDANSETRON 4 MG PO TBDP: 4.0000 mg | ORAL_TABLET | Freq: Four times a day (QID) | ORAL | 0 refills | Status: DC | PRN

## 2018-11-22 MED ORDER — PANTOPRAZOLE SODIUM 40 MG PO TBEC: 40.0000 mg | DELAYED_RELEASE_TABLET | Freq: Every day | ORAL | 0 refills | Status: DC

## 2018-11-22 MED ORDER — SODIUM CHLORIDE 0.9% FLUSH: 3.0000 mL | Freq: Once | INTRAVENOUS | Status: DC

## 2018-11-22 NOTE — ED Notes (Signed)
aBD PAIN  PT WAS SEEN BY EDP BEFORE MYSELF  SEE HER NOTES

## 2018-11-22 NOTE — ED Notes (Signed)
Patient transported to Ultrasound 

## 2018-11-22 NOTE — ED Notes (Signed)
Pt returned from u/s

## 2018-11-22 NOTE — ED Provider Notes (Signed)
TIME SEEN: 4:50 AM  CHIEF COMPLAINT: Right upper abdominal pain  HPI: Patient is a 33 year old female with previous history of "gallbladder sludge" who presents to the emergency department with right upper quadrant abdominal pain that started last night.  She has had nausea but no vomiting.  No diarrhea.  No fevers or chills.  No cough, chest pain or shortness of breath.  No dysuria, hematuria, vaginal bleeding or discharge.  Has had previous hysterectomy.  States symptoms have already improved but not completely resolved.  ROS: See HPI Constitutional: no fever  Eyes: no drainage  ENT: no runny nose   Cardiovascular:  no chest pain  Resp: no SOB  GI: no vomiting GU: no dysuria Integumentary: no rash  Allergy: no hives  Musculoskeletal: no leg swelling  Neurological: no slurred speech ROS otherwise negative  PAST MEDICAL HISTORY/PAST SURGICAL HISTORY:  History reviewed. No pertinent past medical history.  MEDICATIONS:  Prior to Admission medications   Not on File    ALLERGIES:  Not on File  SOCIAL HISTORY:  Social History   Tobacco Use  . Smoking status: Current Every Day Smoker  . Smokeless tobacco: Never Used  Substance Use Topics  . Alcohol use: Yes    Comment: occasionally    FAMILY HISTORY: No family history on file.  EXAM: BP (!) 145/101 (BP Location: Right Arm)   Pulse 100   Temp 98.3 F (36.8 C)   Resp 18   Ht 5\' 5"  (1.651 m)   Wt 72.6 kg   SpO2 100%   BMI 26.63 kg/m  CONSTITUTIONAL: Alert and oriented and responds appropriately to questions. Well-appearing; well-nourished HEAD: Normocephalic EYES: Conjunctivae clear, pupils appear equal, EOMI ENT: normal nose; moist mucous membranes NECK: Supple, no meningismus, no nuchal rigidity, no LAD  CARD: RRR; S1 and S2 appreciated; no murmurs, no clicks, no rubs, no gallops RESP: Normal chest excursion without splinting or tachypnea; breath sounds clear and equal bilaterally; no wheezes, no rhonchi, no  rales, no hypoxia or respiratory distress, speaking full sentences ABD/GI: Normal bowel sounds; non-distended; soft, tender to palpation in the right upper quadrant without a positive Livecchi sign, no tenderness at McBurney's point, no rebound, no guarding, no peritoneal signs, no hepatosplenomegaly BACK:  The back appears normal and is non-tender to palpation, there is no CVA tenderness EXT: Normal ROM in all joints; non-tender to palpation; no edema; normal capillary refill; no cyanosis, no calf tenderness or swelling    SKIN: Normal color for age and race; warm; no rash NEURO: Moves all extremities equally PSYCH: The patient's mood and manner are appropriate. Grooming and personal hygiene are appropriate.  MEDICAL DECISION MAKING: Patient here with right upper quadrant abdominal pain.  Labs show no leukocytosis, normal LFTs and lipase.  She is status post hysterectomy.  No tenderness at McBurney's point.  Will obtain right upper quadrant ultrasound.  Will give pain and nausea medicine.  ED PROGRESS: Patient's ultrasound shows no acute abnormality.  She still points to her upper abdomen is the source of her pain.  Discussed with her that this could be gastritis, duodenitis, peptic ulcer, H. pylori.  I do not feel she needs CT imaging today.  She states she is feeling much better.  We will start her on a PPI and have her follow-up with GI if symptoms continue.  Discussed return precautions.  She is comfortable with this plan.  At this time, I do not feel there is any life-threatening condition present. I have reviewed and discussed  all results (EKG, imaging, lab, urine as appropriate) and exam findings with patient/family. I have reviewed nursing notes and appropriate previous records.  I feel the patient is safe to be discharged home without further emergent workup and can continue workup as an outpatient as needed. Discussed usual and customary return precautions. Patient/family verbalize understanding  and are comfortable with this plan.  Outpatient follow-up has been provided as needed. All questions have been answered.       Kenneth Cuaresma, Layla MawKristen N, DO 11/22/18 [phone]

## 2018-11-22 NOTE — ED Notes (Signed)
Pt back from US

## 2018-11-22 NOTE — ED Triage Notes (Signed)
Sudden onset of RUQ abdominal pain that started around 1930.  Hx of the same with galbladder sludge.  Reports the pain usually will go away on its own but its not stopping this time.

## 2018-11-24 ENCOUNTER — Encounter (HOSPITAL_COMMUNITY): Payer: Self-pay | Admitting: Obstetrics and Gynecology

## 2018-12-14 ENCOUNTER — Emergency Department (HOSPITAL_BASED_OUTPATIENT_CLINIC_OR_DEPARTMENT_OTHER)
Admission: EM | Admit: 2018-12-14 | Discharge: 2018-12-14 | Disposition: A | Discharge status: Home / Self Care | Payer: Medicaid Other | Attending: Emergency Medicine | Admitting: Emergency Medicine

## 2018-12-14 ENCOUNTER — Encounter (HOSPITAL_BASED_OUTPATIENT_CLINIC_OR_DEPARTMENT_OTHER): Payer: Self-pay | Admitting: *Deleted

## 2018-12-14 ENCOUNTER — Other Ambulatory Visit: Payer: Self-pay

## 2018-12-14 DIAGNOSIS — I10 Essential (primary) hypertension: Secondary | ICD-10-CM | POA: Insufficient documentation

## 2018-12-14 DIAGNOSIS — F17210 Nicotine dependence, cigarettes, uncomplicated: Secondary | ICD-10-CM | POA: Diagnosis not present

## 2018-12-14 DIAGNOSIS — M5431 Sciatica, right side: Secondary | ICD-10-CM | POA: Insufficient documentation

## 2018-12-14 DIAGNOSIS — M545 Low back pain: Secondary | ICD-10-CM | POA: Diagnosis present

## 2018-12-14 DIAGNOSIS — R202 Paresthesia of skin: Secondary | ICD-10-CM | POA: Insufficient documentation

## 2018-12-14 DIAGNOSIS — Z79899 Other long term (current) drug therapy: Secondary | ICD-10-CM | POA: Insufficient documentation

## 2018-12-14 HISTORY — DX: Essential (primary) hypertension: I10

## 2018-12-14 MED ORDER — HYDROCODONE-ACETAMINOPHEN 5-325 MG PO TABS: 1.0000 | ORAL_TABLET | ORAL | 0 refills | Status: DC | PRN

## 2018-12-14 MED ORDER — HYDROMORPHONE HCL 1 MG/ML IJ SOLN
1.0000 mg | Freq: Once | INTRAMUSCULAR | Status: AC
  Administered 2018-12-14: 1 mg via INTRAMUSCULAR
  Filled 2018-12-14: qty 1

## 2018-12-14 MED ORDER — PREDNISONE 20 MG PO TABS: ORAL_TABLET | ORAL | 0 refills | Status: DC

## 2018-12-14 MED ORDER — HYDROCODONE-ACETAMINOPHEN 5-325 MG PO TABS: 1.0000 | ORAL_TABLET | ORAL | 0 refills | Status: AC | PRN

## 2018-12-14 NOTE — ED Triage Notes (Addendum)
Pt reports over 1 week of back pain (she works for [employer] and does heavy lifting). States she has had pain in her right leg since Thursday. She has been taking OTC meds without relief

## 2018-12-14 NOTE — ED Notes (Signed)
ED Provider at bedside. 

## 2018-12-14 NOTE — ED Provider Notes (Signed)
[facility] EMERGENCY DEPARTMENT Provider Note   CSN: [id number] Arrival date & time: 12/14/18  1559     History   Chief Complaint Chief Complaint  Patient presents with  . Back Pain    HPI Lori Gentry is a 33 y.o. female.     Patient is a 33 year old female who presents with back pain.  Is been going on for about 1 week.  She describes it as a burning pain in her right lower back that at times radiates to her leg but not persistently.  She occasionally has some tingling in her right foot but denies any ongoing numbness.  No weakness in the leg.  No loss of bowel or bladder function.  No fevers.  No associated domino pain.  No history of similar symptoms in the past.  She denies any known injuries but she does do a lot of heavy lifting at work.  She has been using over-the-counter medications without improvement in symptoms.     Past Medical History:  Diagnosis Date  . Endometriosis 2011   Dx by lap  . Genital herpes   . Hypertension     Patient Active Problem List   Diagnosis Date Noted  . Encounter for sterilization   . 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 Current smoker 12/15/2014  . Tobacco smoking affecting pregnancy, antepartum 12/15/2014    Past Surgical History:  Procedure Laterality Date  . ABDOMINAL HYSTERECTOMY    . KNEE SURGERY  2009  . LAPAROSCOPIC TUBAL LIGATION Bilateral 03/22/2015   Procedure: LAPAROSCOPIC TUBAL LIGATION with Filshie Clips;  Surgeon: Catalina AntiguaPeggy Constant, MD;  Location: [facility];  Service: Gynecology;  Laterality: Bilateral;  Requested 03/22/15 @ 7:30a  . laproscopic exam  2011     OB History    Gravida  4   Para  4   Term  4   Preterm  0   AB  0   Living  4     SAB  0   TAB  0   Ectopic  0   Multiple      Live Births  4            Home Medications    Prior to Admission medications   Medication Sig Start Date End Date Taking? Authorizing Provider  HYDROcodone-acetaminophen  (NORCO/VICODIN) 5-325 MG tablet Take 1-2 tablets by mouth every 4 (four) hours as needed. 12/14/18   Rolan BuccoBelfi, Jadalyn Oliveri, MD  ibuprofen (ADVIL,MOTRIN) 600 MG tablet Take 1 tablet (600 mg total) by mouth every 6 (six) hours as needed. 03/22/15   Constant, Peggy, MD  ondansetron (ZOFRAN ODT) 4 MG disintegrating tablet Take 1 tablet (4 mg total) by mouth every 6 (six) hours as needed. 11/22/18   Ward, Layla MawKristen N, DO  oxyCODONE-acetaminophen (PERCOCET/ROXICET) 5-325 MG tablet Take 1 tablet by mouth every 4 (four) hours as needed. 03/22/15   Constant, Peggy, MD  pantoprazole (PROTONIX) 40 MG tablet Take 1 tablet (40 mg total) by mouth daily. 11/22/18   Ward, Layla MawKristen N, DO  predniSONE (DELTASONE) 20 MG tablet 3 tabs po day one, then 2 po daily x 4 days 12/14/18   Rolan BuccoBelfi, Mohid Furuya, MD    Family History Family History  Problem Relation Age of Onset  . Hypertension Mother   . Lupus Mother   . Hypertension Maternal Grandmother   . Diabetes Maternal Grandfather   . Diabetes Paternal Grandmother     Social History Social History   Tobacco Use  . Smoking  status: Current Every Day Smoker    Packs/day: 0.50    Years: 10.00    Pack years: 5.00    Types: Cigarettes  . Smokeless tobacco: Never Used  Substance Use Topics  . Alcohol use: Not Currently    Comment: occasionally  . Drug use: Never     Allergies   Cefzil [cefprozil]   Review of Systems Review of Systems  Constitutional: Negative for chills, diaphoresis, fatigue and fever.  HENT: Negative for congestion, rhinorrhea and sneezing.   Eyes: Negative.   Respiratory: Negative for cough, chest tightness and shortness of breath.   Cardiovascular: Negative for chest pain and leg swelling.  Gastrointestinal: Negative for abdominal pain, blood in stool, diarrhea, nausea and vomiting.  Genitourinary: Negative for difficulty urinating, flank pain, frequency and hematuria.  Musculoskeletal: Positive for back pain. Negative for arthralgias.  Skin:  Negative for rash.  Neurological: Negative for dizziness, speech difficulty, weakness, numbness and headaches.     Physical Exam Updated Vital Signs BP (!) 158/97 (BP Location: Right Arm)   Pulse 98   Temp 98.5 F (36.9 C) (Oral)   Resp 18   Ht 5\' 4"  (1.626 m)   Wt 72.6 kg   LMP 03/15/2015   SpO2 100%   BMI 27.46 kg/m   Physical Exam Constitutional:      Appearance: She is well-developed.  HENT:     Head: Normocephalic and atraumatic.  Eyes:     Pupils: Pupils are equal, round, and reactive to light.  Neck:     Musculoskeletal: Normal range of motion and neck supple.  Cardiovascular:     Rate and Rhythm: Normal rate and regular rhythm.     Heart sounds: Normal heart sounds.  Pulmonary:     Effort: Pulmonary effort is normal. No respiratory distress.     Breath sounds: Normal breath sounds. No wheezing or rales.  Chest:     Chest wall: No tenderness.  Abdominal:     General: Bowel sounds are normal.     Palpations: Abdomen is soft.     Tenderness: There is no abdominal tenderness. There is no guarding or rebound.  Musculoskeletal: Normal range of motion.     Comments: Positive tenderness over the right sciatic nerve.  No bony tenderness noted to the spine.  negative straight leg raise bilaterally.  Pedal pulses are intact.  She has normal sensation and motor function distally.  Patellar reflexes slightly diminished on the right as compared to the left.  Lymphadenopathy:     Cervical: No cervical adenopathy.  Skin:    General: Skin is warm and dry.     Findings: No rash.  Neurological:     Mental Status: She is alert and oriented to person, place, and time.      ED Treatments / Results  Labs (all labs ordered are listed, but only abnormal results are displayed) Labs Reviewed - No data to display  EKG None  Radiology No results found.  Procedures Procedures (including critical care time)  Medications Ordered in ED Medications  HYDROmorphone (DILAUDID)  injection 1 mg (has no administration in time range)     Initial Impression / Assessment and Plan / ED Course  I have reviewed the triage vital signs and the nursing notes.  Pertinent labs & imaging results that were available during my care of the patient were reviewed by me and considered in my medical decision making (see chart for details).        Patient with radicular  low back pain.  She has no neurologic deficits or suggestions of chronic equina.  No fevers or bony tenderness on the spine that would be more concerning for other etiologies such as epidural abscess.  She was given a shot of Dilaudid in the ED.  She was discharged home in good condition.  She was given a prescription for a prednisone burst and a short course of Vicodin.  She was advised following the prednisone, she can start back with ibuprofen.  She was also advised to use ice to the area.  She was given a referral to follow-up with orthopedics.  Return precautions were given.  Final Clinical Impressions(s) / ED Diagnoses   Final diagnoses:  Sciatica of right side    ED Discharge Orders         Ordered    HYDROcodone-acetaminophen (NORCO/VICODIN) 5-325 MG tablet  Every 4 hours PRN,   Status:  Discontinued     12/14/18 1645    predniSONE (DELTASONE) 20 MG tablet     12/14/18 1645    HYDROcodone-acetaminophen (NORCO/VICODIN) 5-325 MG tablet  Every 4 hours PRN     12/14/18 1646           Malvin Johns, MD 12/14/18 1650

## 2018-12-16 ENCOUNTER — Ambulatory Visit: Payer: Self-pay

## 2018-12-16 ENCOUNTER — Telehealth: Payer: Self-pay | Admitting: Family Medicine

## 2018-12-16 ENCOUNTER — Other Ambulatory Visit: Payer: Self-pay

## 2018-12-16 ENCOUNTER — Encounter: Payer: Self-pay | Admitting: Family Medicine

## 2018-12-16 ENCOUNTER — Ambulatory Visit (INDEPENDENT_AMBULATORY_CARE_PROVIDER_SITE_OTHER): Payer: Medicaid Other | Admitting: Family Medicine

## 2018-12-16 DIAGNOSIS — M5441 Lumbago with sciatica, right side: Secondary | ICD-10-CM

## 2018-12-16 MED ORDER — BACLOFEN 10 MG PO TABS: 5.0000 mg | ORAL_TABLET | Freq: Three times a day (TID) | ORAL | 1 refills | Status: DC | PRN

## 2018-12-16 NOTE — Telephone Encounter (Signed)
Pt called in said dr.hilts was going to be sending her to physical therapy at [facility] physical therapy but they don't take Medicaid and so she needs for the referral to be sent somewhere that does take her insurance.   [phone]

## 2018-12-16 NOTE — Progress Notes (Signed)
Office Visit Note   Patient: Lori Gentry           Date of Birth: 06/18/1985           MRN: [id number] Visit Date: 12/16/2018 Requested by: No referring provider defined for this encounter. PCP: Patient, No Pcp Per  Subjective: Chief Complaint  Patient presents with  . Lower Back - Pain    Low back pain since 12/05/2018. Started in the right buttock area. Burning and tingling in the right leg at times. 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 Lumbar corset helps.    HPI: She is here with low back and right leg pain.  On around July 10 she was at work at [employer].  She was doing repetitive lifting of very heavy packages requiring her to squat while lifting.  By the end of her shift she had substantial pain in her right buttocks area with radiation into the right leg.  Her pain has persisted, she is walking with a limp and she gets tingling occasionally down into her foot on the dorsum near the second and third toes.  Pain is better when lying flat, worse when bending forward.  She denies bowel or bladder dysfunction.  She is never had problems with her back before.  She was given prednisone and hydrocodone at urgent care facility a couple days ago.               ROS:   All other systems were reviewed and are negative.  Objective: Vital Signs: LMP 03/15/2015   Physical Exam:  General:  Alert and oriented, in no acute distress. Pulm:  Breathing unlabored. Psy:  Normal mood, congruent affect. Skin: No rash on her skin. Low back: There is no significant spinous process tenderness.  She is moderately tender to the right of L4 in the paraspinous muscles.  She is very tender in the sciatic notch area, and along the course of the piriformis muscle.  Stork test alleviates pain, straight leg raising is equivocal, piriformis stretch does not significantly exacerbate her pain.  Lower extremity strength and reflexes are normal.  She does have pain improvement with prone hyperextended position.   Imaging: X-rays lumbar spine: No scoliosis, normal anatomic alignment, well-preserved disc spaces but no acute fracture.   Assessment & Plan: 1.  Low back and right posterior hip and leg pain, concerning for lumbar disc protrusion.  Neurologic exam is nonfocal.  Piriformis sprain is another consideration. -Light duty restrictions for the next 4 weeks while she pursues physical therapy.  Baclofen as needed for muscle spasm.  We will see her back prior to returning to regular duty to make sure she is recovering.  If she fails to improve, consider MRI scan.     Procedures: No procedures performed  No notes on file     PMFS History: Patient Active Problem List   Diagnosis Date Noted  . Encounter for sterilization   . 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 Current smoker 12/15/2014  . Tobacco smoking affecting pregnancy, antepartum 12/15/2014   Past Medical History:  Diagnosis Date  . Endometriosis 2011   Dx by lap  . Genital herpes   . Hypertension     Family History  Problem Relation Age of Onset  . Hypertension Mother   . Lupus Mother   . Hypertension Maternal Grandmother   . Diabetes Maternal Grandfather   . Diabetes Paternal Grandmother     Past Surgical History:  Procedure  Laterality Date  . ABDOMINAL HYSTERECTOMY    . KNEE SURGERY  2009  . LAPAROSCOPIC TUBAL LIGATION Bilateral 03/22/2015   Procedure: LAPAROSCOPIC TUBAL LIGATION with Filshie Clips;  Surgeon: Catalina AntiguaPeggy Constant, MD;  Location: [facility];  Service: Gynecology;  Laterality: Bilateral;  Requested 03/22/15 @ 7:30a  . laproscopic exam  2011   Social History   Occupational History  . Not on file  Tobacco Use  . Smoking status: Current Every Day Smoker    Packs/day: 0.50    Years: 10.00    Pack years: 5.00    Types: Cigarettes  . Smokeless tobacco: Never Used  Substance and Sexual Activity  . Alcohol use: Not Currently    Comment: occasionally  . Drug use: Never  . Sexual activity:  Yes    Birth control/protection: None

## 2018-12-16 NOTE — Telephone Encounter (Signed)
I called the patient. I put in a new order for PT for [facility] on [address]. I gave her the phone number so she could call them to schedule, if she would like.

## 2018-12-22 ENCOUNTER — Ambulatory Visit: Payer: Medicaid Other | Admitting: Family Medicine

## 2018-12-25 ENCOUNTER — Ambulatory Visit: Payer: Medicaid Other | Attending: Family Medicine | Admitting: Physical Therapy

## 2018-12-25 ENCOUNTER — Other Ambulatory Visit: Payer: Self-pay

## 2018-12-25 ENCOUNTER — Encounter: Payer: Self-pay | Admitting: Physical Therapy

## 2018-12-25 DIAGNOSIS — M5441 Lumbago with sciatica, right side: Secondary | ICD-10-CM | POA: Diagnosis present

## 2018-12-25 NOTE — Therapy (Signed)
Lori Gentry, [place], [address] Phone: [phone]   Fax:  [phone]  Physical Therapy Evaluation  Patient Details  Name: Lori Gentry MRN: [id number] Date of Birth: 11/01/1985 Referring Provider (PT): Dr Eunice Blase    Encounter Date: 12/25/2018  PT End of Session - 12/25/18 1223    Visit Number  1    Number of Visits  4    Date for PT Re-Evaluation  01/22/19    Authorization Type  Medicaid    PT Start Time  1130    PT Stop Time  1220    PT Time Calculation (min)  50 min    Activity Tolerance  Patient tolerated treatment well    Behavior During Therapy  [facility] for tasks assessed/performed       Past Medical History:  Diagnosis Date  . Endometriosis 2011   Dx by lap  . Genital herpes   . Hypertension     Past Surgical History:  Procedure Laterality Date  . ABDOMINAL HYSTERECTOMY    . KNEE SURGERY  2009  . LAPAROSCOPIC TUBAL LIGATION Bilateral 03/22/2015   Procedure: LAPAROSCOPIC TUBAL LIGATION with Filshie Clips;  Surgeon: Mora Bellman, MD;  Location: [facility];  Service: Gynecology;  Laterality: Bilateral;  Requested 03/22/15 @ 7:30a  . laproscopic exam  2011    There were no vitals filed for this visit.   Subjective Assessment - 12/25/18 1135    Subjective  The patient was picking up a heavy object 3 weeks ago at work when she began having significant lower back pain. She is having tingling down int her foot. She has no history of lower back pain.    Limitations  Standing;Walking    How long can you sit comfortably?  Sitting increases pain and tigling    How long can you walk comfortably?  walking increased pain but has improved    Diagnostic tests  X-ray:    Currently in Pain?  Yes    Pain Score  5     Pain Orientation  Right    Pain Descriptors / Indicators  Aching;Tingling    Pain Type  Chronic pain    Pain Radiating Towards  right leg tigling    Pain Frequency  Constant    Aggravating Factors   bending    Pain Relieving Factors  rest         [facility] PT Assessment - 12/25/18 0001      Assessment   Medical Diagnosis  right Low back pain with radiculopathy     Referring Provider (PT)  Dr Legrand Como Hilts     Onset Date/Surgical Date  --   3 weeks prior    Hand Dominance  Right    Next MD Visit  01/07/2019    Prior Therapy  None       Precautions   Precautions  None      Restrictions   Weight Bearing Restrictions  No      Balance Screen   Has the patient fallen in the past 6 months  No    Has the patient had a decrease in activity level because of a fear of falling?   No    Is the patient reluctant to leave their home because of a fear of falling?   No      Prior Function   Level of Independence  Independent      Cognition   Overall Cognitive Status  Within Functional Limits for  tasks assessed    Attention  Focused    Focused Attention  Appears intact    Memory  Appears intact    Awareness  Appears intact    Problem Solving  Appears intact      Observation/Other Assessments   Focus on Therapeutic Outcomes (FOTO)   Mediciad       Sensation   Light Touch  Appears Intact    Additional Comments  Pain and tingling into right great toe       Coordination   Gross Motor Movements are Fluid and Coordinated  Yes    Fine Motor Movements are Fluid and Coordinated  Yes      ROM / Strength   AROM / PROM / Strength  AROM;PROM;Strength      AROM   AROM Assessment Site  Lumbar    Lumbar Flexion  limited 75%     Lumbar Extension  improved pain     Lumbar - Right Side Bend  --   painful    Lumbar - Right Rotation  limited 25%     Lumbar - Left Rotation  limited 25%       PROM   PROM Assessment Site  Hip    Right/Left Hip  Right;Left      Strength   Overall Strength Comments  5/5 gross left     Strength Assessment Site  Hip    Right/Left Hip  Right    Right Hip Flexion  4/5    Right Hip ABduction  4+/5    Right Hip ADduction  4+/5      Right  Hip   Right Hip Flexion  80   with pain      Palpation   Palpation comment  spasming of right parapsinals into the right gluteal       Ambulation/Gait   Gait Comments  decreased weight bearing on right side, decreased hip rotation on the right, decreased hip flexion                 Objective measurements completed on examination: See above findings.      [facility] Adult PT Treatment/Exercise - 12/25/18 0001      Exercises   Exercises  Lumbar      Lumbar Exercises: Standing   Other Standing Lumbar Exercises  trigger point release withtennis ball       Lumbar Exercises: Prone   Other Prone Lumbar Exercises  prone on elbows-> elbow press ups pain free              PT Education - 12/25/18 1154    Person(s) Educated  Patient       PT Short Term Goals - 12/25/18 1249      PT SHORT TERM GOAL #1   Title  Patient will be independent with Mckenzie progression    Time  3    Period  Weeks    Status  New    Target Date  01/15/19      PT SHORT TERM GOAL #2   Title  Patient will report centralized low back pain with no tigling into her right toe    Time  3    Period  Weeks    Status  New    Target Date  01/15/19      PT SHORT TERM GOAL #3   Title  Patient will ambualte 300' without an antalgic gait    Time  3    Period  Weeks  Status  New    Target Date  01/15/19                Plan - 12/25/18 1224    Clinical Impression Statement  Patient is a 33 year old female with right sided low back pain and radiculopathy. Signs, symptoms, and MOI are consitent with diagnosis of herniated lumbar disc. She has limited flexion and improved symtoms with extension. She has pain and tignling that radiates to her toe. The tingling moved up into her calf with low range prone pressups and into her thigh with corssed LAD with grade ii oscilations. She was also given a tennis ball for trigger point release. She would benefit from skilled therapy for a mckezie  progression, soft tissue mobilization, and lift education with a return to work progression    Personal Factors and Comorbidities  Age;Sex;Comorbidity 1    Examination-Activity Limitations  Lift;Transfers;Stand    Examination-Participation Restrictions  Community Activity;Other   work   Stability/Clinical Decision Making  Evolving/Moderate complexity   tingling with movement   Clinical Decision Making  Low    Rehab Potential  Excellent    PT Frequency  1x / week    PT Duration  3 weeks    PT Treatment/Interventions  ADLs/Self Care Home Management;Cryotherapy;Electrical Stimulation;Moist Heat;Ultrasound;DME Instruction;Gait training;Stair training;Functional mobility training;Therapeutic activities;Therapeutic exercise;Neuromuscular re-education;Patient/family education;Manual techniques;Passive range of motion;Dry needling;Taping;Joint Manipulations;Spinal Manipulations    PT Next Visit Plan  continue to work on centralization; Consider side lying spinal mobilizations; continue with LAD; soft titssue mobilizations, begin lightcore strengthening,rigger point dry needling.    PT Home Exercise Plan  prone, prone  on elbows, tennis ball release, prone press-ups    Consulted and Agree with Plan of Care  Patient       Patient will benefit from skilled therapeutic intervention in order to improve the following deficits and impairments:  Abnormal gait, Increased muscle spasms, Decreased activity tolerance, Decreased strength, Decreased knowledge of precautions, Decreased range of motion, Improper body mechanics, Decreased mobility, Decreased endurance  Visit Diagnosis: 1. Acute right-sided low back pain with right-sided sciatica        Problem List Patient Active Problem List   Diagnosis Date Noted  . Encounter for sterilization   . 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 Current smoker 12/15/2014  . Tobacco smoking affecting pregnancy, antepartum 12/15/2014    Dessie Comaavid J  Anea Fodera PT DPT  12/25/2018, 1:08 PM  [facility] [address] [place], [place], [address] Phone: [phone]   Fax:  [phone]  Name: Ma RingsWhitney Feutz MRN: [id number] Date of Birth: 04/09/1986

## 2019-01-02 ENCOUNTER — Encounter: Payer: Self-pay | Admitting: Physical Therapy

## 2019-01-02 ENCOUNTER — Ambulatory Visit: Payer: Medicaid Other | Attending: Family Medicine | Admitting: Physical Therapy

## 2019-01-02 ENCOUNTER — Encounter

## 2019-01-02 ENCOUNTER — Other Ambulatory Visit: Payer: Self-pay

## 2019-01-02 DIAGNOSIS — M5441 Lumbago with sciatica, right side: Secondary | ICD-10-CM | POA: Diagnosis present

## 2019-01-02 NOTE — Patient Instructions (Signed)

## 2019-01-02 NOTE — Therapy (Signed)
[facility] [address] [place], [place], [address] Phone: [phone]   Fax:  [phone]  Physical Therapy Treatment  Patient Details  Name: Lori Gentry MRN: [id number] Date of Birth: 05/08/1986 Referring Provider (PT): Dr Lavada MesiMichael Hilts    Encounter Date: 01/02/2019  PT End of Session - 01/02/19 0757    Visit Number  2    Number of Visits  4    Date for PT Re-Evaluation  01/22/19    Authorization Type  Medicaid    Authorization Time Period  12/26/2018 - 01/15/2019    Authorization - Visit Number  1    Authorization - Number of Visits  3    PT Start Time  0744    Activity Tolerance  Patient tolerated treatment well    Behavior During Therapy  [facility] for tasks assessed/performed       Past Medical History:  Diagnosis Date  . Endometriosis 2011   Dx by lap  . Genital herpes   . Hypertension     Past Surgical History:  Procedure Laterality Date  . ABDOMINAL HYSTERECTOMY    . KNEE SURGERY  2009  . LAPAROSCOPIC TUBAL LIGATION Bilateral 03/22/2015   Procedure: LAPAROSCOPIC TUBAL LIGATION with Filshie Clips;  Surgeon: Catalina AntiguaPeggy Constant, MD;  Location: [facility];  Service: Gynecology;  Laterality: Bilateral;  Requested 03/22/15 @ 7:30a  . laproscopic exam  2011    There were no vitals filed for this visit.  Subjective Assessment - 01/02/19 0749    Subjective  "I am doing much better, only a little pain inthe back. No pain down the legs today'    Currently in Pain?  Yes    Pain Score  3     Pain Location  Back    Pain Orientation  Right    Pain Type  Chronic pain    Pain Frequency  Intermittent                       [facility] Adult PT Treatment/Exercise - 01/02/19 0001      Self-Care   Self-Care  Posture    Posture  posture and lifting mechanics       Lumbar Exercises: Stretches   Lower Trunk Rotation  --   1 x 20   Prone on Elbows Stretch  10 seconds   10 reps   Press Ups  5 seconds;15 reps      Lumbar Exercises: Supine   Dead Bug  5 reps   10 sec hold   Dead Bug Limitations  tactile cues to keep back flat    Bridge  10 reps   x 2 sets     Manual Therapy   Manual Therapy  Joint mobilization;Soft tissue mobilization    Manual therapy comments  skilled palpation and monitoring of pt throughout TPDN    Joint Mobilization  PA Grade III L1-L5,     Soft tissue mobilization  IASTM along bil lumbar parapsinals       Trigger Point Dry Needling - 01/02/19 0001    Consent Given?  Yes    Education Handout Provided  Yes    Muscles Treated Back/Hip  Erector spinae    Erector spinae Response  Twitch response elicited;Palpable increased muscle length   R L3 & L5          PT Education - 01/02/19 0755    Education Details  muscle anatomy and referral patterns. What TPDN is/ benefits.  Person(s) Educated  Patient    Methods  Explanation;Verbal cues    Comprehension  Verbalized understanding;Verbal cues required       PT Short Term Goals - 12/25/18 1249      PT SHORT TERM GOAL #1   Title  Patient will be independent with Mckenzie progression    Time  3    Period  Weeks    Status  New    Target Date  01/15/19      PT SHORT TERM GOAL #2   Title  Patient will report centralized low back pain with no tigling into her right toe    Time  3    Period  Weeks    Status  New    Target Date  01/15/19      PT SHORT TERM GOAL #3   Title  Patient will ambualte 300' without an antalgic gait    Time  3    Period  Weeks    Status  New    Target Date  01/15/19               Plan - 01/02/19 0827    Clinical Impression Statement  pt reports significant improvement in back pain rating it at a 3/10 today and notes no more referral down the legs. Edcuated and consent was provided for TPDN focusing on R lumbar paraspinals followed with IASTM techniques and mobs. she performed exercises well noting decreased pain.    PT Next Visit Plan  extension bias progression,  Consider side  lying spinal mobilizations; continue with LAD; soft titssue mobilizations, add core strengthening to hEP,response to DN, functiona lifting/ pushing/ pulling activities    PT Home Exercise Plan  prone, prone  on elbows, tennis ball release, prone press-ups       Patient will benefit from skilled therapeutic intervention in order to improve the following deficits and impairments:     Visit Diagnosis: 1. Acute right-sided low back pain with right-sided sciatica        Problem List Patient Active Problem List   Diagnosis Date Noted  . Encounter for sterilization   . 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 Current smoker 12/15/2014  . Tobacco smoking affecting pregnancy, antepartum 12/15/2014    Starr Lake PT, DPT, LAT, ATC  01/02/19  8:30 AM      [facility] [address] [place], [place], [address] Phone: [phone]   Fax:  [phone]  Name: Lori Gentry MRN: [id number] Date of Birth: May 27, 1986

## 2019-01-07 ENCOUNTER — Encounter: Payer: Self-pay | Admitting: Family Medicine

## 2019-01-07 ENCOUNTER — Ambulatory Visit (INDEPENDENT_AMBULATORY_CARE_PROVIDER_SITE_OTHER): Payer: Medicaid Other | Admitting: Family Medicine

## 2019-01-07 DIAGNOSIS — M5441 Lumbago with sciatica, right side: Secondary | ICD-10-CM | POA: Diagnosis not present

## 2019-01-07 MED ORDER — BACLOFEN 10 MG PO TABS: 5.0000 mg | ORAL_TABLET | Freq: Three times a day (TID) | ORAL | 1 refills | Status: AC | PRN

## 2019-01-07 NOTE — Progress Notes (Signed)
Office Visit Note   Patient: Lori Searfoss           Ma RingsDate of Birth: 11/17/1985           MRN: [id number] Visit Date: 01/07/2019 Requested by: No referring provider defined for this encounter. PCP: Patient, No Pcp Per  Subjective: Chief Complaint  Patient presents with  . Lower Back - Pain, Follow-up    Getting some better. Back at work. Going to Pt.    HPI: She is here for follow-up low back and right leg pain secondary to injury at work.  She has been on light duty, no lifting over 20 pounds.  She has been doing physical therapy and to be McKenzie exercises are helping significantly.  She still has pain radiating into the back of her right leg toward the knee which gets worse after a day of work, all the way down to her foot, but when she goes home and does her exercises the pain improves significantly.  Baclofen as needed seems to be helping as well.  Denies bowel or bladder dysfunction, weakness in her leg.              ROS: No fevers or chills.  All other systems were reviewed and are negative.  Objective: Vital Signs: LMP 03/15/2015   Physical Exam:  General:  Alert and oriented, in no acute distress. Pulm:  Breathing unlabored. Psy:  Normal mood, congruent affect. Skin: No rash on her skin. Low back: No significant midline tenderness.  She does have a little pain in the right medius area.  Straight leg raise is negative, lower extremity strength and reflexes are still normal.  Imaging: None today.  Assessment & Plan: 1.  Improved low back and right leg pain with suspected lumbar disc herniation secondary to injury at work. -Continue with physical therapy.  Continue light duty restrictions for 4 more weeks.  Return at that point for recheck. -Once her pain has centralized, we will allow her to return to regular duty work.  If she tolerates that with no flareup, then we will release her from care.     Procedures: No procedures performed  No notes on file     PMFS  History: Patient Active Problem List   Diagnosis Date Noted  . Encounter for sterilization   . 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 Current smoker 12/15/2014  . Tobacco smoking affecting pregnancy, antepartum 12/15/2014   Past Medical History:  Diagnosis Date  . Endometriosis 2011   Dx by lap  . Genital herpes   . Hypertension     Family History  Problem Relation Age of Onset  . Hypertension Mother   . Lupus Mother   . Hypertension Maternal Grandmother   . Diabetes Maternal Grandfather   . Diabetes Paternal Grandmother     Past Surgical History:  Procedure Laterality Date  . ABDOMINAL HYSTERECTOMY    . KNEE SURGERY  2009  . LAPAROSCOPIC TUBAL LIGATION Bilateral 03/22/2015   Procedure: LAPAROSCOPIC TUBAL LIGATION with Filshie Clips;  Surgeon: Catalina AntiguaPeggy Constant, MD;  Location: [facility];  Service: Gynecology;  Laterality: Bilateral;  Requested 03/22/15 @ 7:30a  . laproscopic exam  2011   Social History   Occupational History  . Not on file  Tobacco Use  . Smoking status: Current Every Day Smoker    Packs/day: 0.50    Years: 10.00    Pack years: 5.00    Types: Cigarettes  . Smokeless tobacco: Never Used  Substance and Sexual Activity  . Alcohol use: Not Currently    Comment: occasionally  . Drug use: Never  . Sexual activity: Yes    Birth control/protection: None

## 2019-01-09 ENCOUNTER — Encounter

## 2019-01-12 ENCOUNTER — Other Ambulatory Visit: Payer: Self-pay

## 2019-01-12 ENCOUNTER — Encounter: Payer: Self-pay | Admitting: Physical Therapy

## 2019-01-12 ENCOUNTER — Ambulatory Visit: Payer: Medicaid Other | Admitting: Physical Therapy

## 2019-01-12 DIAGNOSIS — M5441 Lumbago with sciatica, right side: Secondary | ICD-10-CM | POA: Diagnosis not present

## 2019-01-12 NOTE — Therapy (Signed)
[facility] [address] [place], [place], [address] Phone: [phone]   Fax:  [phone]  Physical Therapy Treatment  Patient Details  Name: Lori Gentry MRN: [id number] Date of Birth: 08/20/1985 Referring Provider (PT): Dr Lavada MesiMichael Hilts    Encounter Date: 01/12/2019  PT End of Session - 01/12/19 0850    Visit Number  3    Number of Visits  4    Date for PT Re-Evaluation  01/22/19    Authorization Type  Medicaid    Authorization Time Period  12/26/2018 - 01/15/2019    Authorization - Visit Number  2    Authorization - Number of Visits  3    PT Start Time  0848    PT Stop Time  0945    PT Time Calculation (min)  57 min       Past Medical History:  Diagnosis Date  . Endometriosis 2011   Dx by lap  . Genital herpes   . Hypertension     Past Surgical History:  Procedure Laterality Date  . ABDOMINAL HYSTERECTOMY    . KNEE SURGERY  2009  . LAPAROSCOPIC TUBAL LIGATION Bilateral 03/22/2015   Procedure: LAPAROSCOPIC TUBAL LIGATION with Filshie Clips;  Surgeon: Catalina AntiguaPeggy Constant, MD;  Location: [facility];  Service: Gynecology;  Laterality: Bilateral;  Requested 03/22/15 @ 7:30a  . laproscopic exam  2011    There were no vitals filed for this visit.  Subjective Assessment - 01/12/19 0850    Subjective  I am having more numbness in RLE ,woke up last night with RLE whole leg numb. Also, having shooting pain into LLE down to knee.    Currently in Pain?  Yes    Pain Score  5     Pain Location  Back    Pain Orientation  Right    Pain Descriptors / Indicators  Aching    Pain Radiating Towards  right leg N/T to ankle.                       [facility] Adult PT Treatment/Exercise - 01/12/19 0001      Lumbar Exercises: Stretches   Single Knee to Chest Stretch  3 reps;30 seconds    Lower Trunk Rotation  --   1 x 20   Prone on Elbows Stretch  1 rep;60 seconds    Press Ups  5 seconds;15 reps    Piriformis Stretch  3  reps;30 seconds    Piriformis Stretch Limitations  push and pull     Figure 4 Stretch  1 rep;30 seconds    Figure 4 Stretch Limitations  bilat       Lumbar Exercises: Supine   Bridge  10 reps   x 2 sets   Bridge Limitations  starting with bent knee raises, progressed to table top with taps then dead bug all with verbal and tactile cues.       Lumbar Exercises: Prone   Straight Leg Raise  10 reps    Straight Leg Raises Limitations  alternating     Other Prone Lumbar Exercises  --    Other Prone Lumbar Exercises  bent knee hip extension x 10 each x 2 , bialteral ankle squeeze with gluteal squeeze x 10       Modalities   Modalities  Electrical Stimulation;Moist Heat      Moist Heat Therapy   Number Minutes Moist Heat  15 Minutes    Moist Heat Location  Lumbar Spine      Electrical Stimulation   Electrical Stimulation Location  Lumbar , upper gluteals     Electrical Stimulation Action  IFC x 15 minutes     Electrical Stimulation Parameters  75mA    Electrical Stimulation Goals  Pain               PT Short Term Goals - 12/25/18 1249      PT SHORT TERM GOAL #1   Title  Patient will be independent with Mckenzie progression    Time  3    Period  Weeks    Status  New    Target Date  01/15/19      PT SHORT TERM GOAL #2   Title  Patient will report centralized low back pain with no tigling into her right toe    Time  3    Period  Weeks    Status  New    Target Date  01/15/19      PT SHORT TERM GOAL #3   Title  Patient will ambualte 300' without an antalgic gait    Time  3    Period  Weeks    Status  New    Target Date  01/15/19               Plan - 01/12/19 1032    Clinical Impression Statement  Pt reports she liked TPDN however is having more RLE numbness and some shooting pain into LLE. She awoke last night with entire RLE numb. We reviewed current exercises and progressed with prone hip/core strength and piriformis stretching. Trial of IFC with HMP.  Prior to modaites her radicular pain was more proximal but not centralized.    PT Next Visit Plan  assess response to IFC. extension bias progression,  Consider side lying spinal mobilizations; continue with LAD; soft titssue mobilizations, add core strengthening to hEP,response to DN, functiona lifting/ pushing/ pulling activities    PT Home Exercise Plan  prone, prone  on elbows, tennis ball release, prone press-ups       Patient will benefit from skilled therapeutic intervention in order to improve the following deficits and impairments:  Abnormal gait, Increased muscle spasms, Decreased activity tolerance, Decreased strength, Decreased knowledge of precautions, Decreased range of motion, Improper body mechanics, Decreased mobility, Decreased endurance  Visit Diagnosis: 1. Acute right-sided low back pain with right-sided sciatica        Problem List Patient Active Problem List   Diagnosis Date Noted  . Encounter for sterilization   . 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 Current smoker 12/15/2014  . Tobacco smoking affecting pregnancy, antepartum 12/15/2014    Dorene Ar, PTA 01/12/2019, 10:45 AM  [place], [place], [address] Phone: [phone]   Fax:  [phone]  Name: Lori Gentry MRN: [id number] Date of Birth: [birth year]

## 2019-01-14 ENCOUNTER — Other Ambulatory Visit: Payer: Self-pay | Admitting: Family Medicine

## 2019-01-14 ENCOUNTER — Telehealth: Payer: Self-pay | Admitting: Family Medicine

## 2019-01-14 DIAGNOSIS — G8911 Acute pain due to trauma: Secondary | ICD-10-CM

## 2019-01-14 NOTE — Telephone Encounter (Signed)
I advised the patient of the plan. We may move her office visit appointment up, depending on when the MRI will be.

## 2019-01-14 NOTE — Telephone Encounter (Signed)
I ordered an MRI.  She can come in afterward to discuss.

## 2019-01-14 NOTE — Telephone Encounter (Signed)
Please advise 

## 2019-01-14 NOTE — Telephone Encounter (Signed)
Pt states she having severe numbness in her legs, more than before & wanted to know if Dr Junius Roads felt like she should be seen sooner.

## 2019-01-16 ENCOUNTER — Telehealth: Payer: Self-pay | Admitting: *Deleted

## 2019-01-16 NOTE — Telephone Encounter (Signed)
IC pt and left vm asking for a return call to see if she would be ok with going to [facility] to have her MRI in order to be done and have her follow up appt with Dr. Junius Roads.

## 2019-01-16 NOTE — Telephone Encounter (Signed)
Pt called back and is ok to have her MRI done at [facility].

## 2019-01-17 ENCOUNTER — Ambulatory Visit (HOSPITAL_BASED_OUTPATIENT_CLINIC_OR_DEPARTMENT_OTHER)
Admission: RE | Admit: 2019-01-17 | Discharge: 2019-01-17 | Disposition: A | Discharge status: Home / Self Care | Payer: Medicaid Other | Source: Ambulatory Visit | Attending: Family Medicine | Admitting: Family Medicine

## 2019-01-17 ENCOUNTER — Other Ambulatory Visit: Payer: Self-pay

## 2019-01-17 DIAGNOSIS — G8911 Acute pain due to trauma: Secondary | ICD-10-CM | POA: Diagnosis not present

## 2019-01-19 ENCOUNTER — Other Ambulatory Visit: Payer: Self-pay

## 2019-01-19 ENCOUNTER — Telehealth: Payer: Self-pay | Admitting: Family Medicine

## 2019-01-19 ENCOUNTER — Encounter: Payer: Self-pay | Admitting: Physical Therapy

## 2019-01-19 ENCOUNTER — Ambulatory Visit: Payer: Medicaid Other | Admitting: Physical Therapy

## 2019-01-19 DIAGNOSIS — M5441 Lumbago with sciatica, right side: Secondary | ICD-10-CM

## 2019-01-19 NOTE — Telephone Encounter (Signed)
I spoke with the patient about her results and the recommendations for smoking cessation and starting the vitamins for bone healing/health. She works as a Higher education careers adviser at [employer]. She said there are no desk-type jobs available to her. Even with her current restrictions, she has to stand all shift. She reports that one day the safety officer at work asked her how she was doing - she told her that her leg was going numb, so she was sent home instead of trying to find something she could do while sitting. Please advise.

## 2019-01-19 NOTE — Telephone Encounter (Signed)
Since this was a work-related injury, they are obligated to either accommodate the light duty restrictions, or else pay her workman's comp pay while she stays at home.  She needs to talk to her supervisor or HR about this.  New letter printed.

## 2019-01-19 NOTE — Therapy (Addendum)
[place], [place], [address] Phone: [phone]   Fax:  [phone]  Physical Therapy Treatment/ Re-cert/Discharge    Patient Details  Name: Lori Gentry MRN: [id number] Date of Birth: 09-Aug-1985 Referring Provider (PT): Dr Eunice Blase    Encounter Date: 01/19/2019  PT End of Session - 01/19/19 1434    Visit Number  4    Number of Visits  12    Date for PT Re-Evaluation  02/16/19    Authorization Type  Medicaid    Authorization Time Period  12/26/2018 - 01/15/2019    PT Start Time  0845    PT Stop Time  0926    PT Time Calculation (min)  41 min    Activity Tolerance  Patient tolerated treatment well    Behavior During Therapy  [facility] for tasks assessed/performed       Past Medical History:  Diagnosis Date  . Endometriosis 2011   Dx by lap  . Genital herpes   . Hypertension     Past Surgical History:  Procedure Laterality Date  . ABDOMINAL HYSTERECTOMY    . KNEE SURGERY  2009  . LAPAROSCOPIC TUBAL LIGATION Bilateral 03/22/2015   Procedure: LAPAROSCOPIC TUBAL LIGATION with Filshie Clips;  Surgeon: Mora Bellman, MD;  Location: [facility];  Service: Gynecology;  Laterality: Bilateral;  Requested 03/22/15 @ 7:30a  . laproscopic exam  2011    There were no vitals filed for this visit.  Subjective Assessment - 01/19/19 0853    Subjective  Patient continues to have numbness going into her right leg to her calf.  It was down to her toes yesterday. She has had her MRI. She hasnt discussed the reuslts with her MD yet. She has been having increased pain when she is at work    Limitations  Standing;Walking    How long can you sit comfortably?  Sitting increases pain and tigling    How long can you walk comfortably?  walking increased pain but has improved    Diagnostic tests  MRI: fracture of the right sacral alla    Currently in Pain?  Yes    Pain Score  4     Pain Location  Back    Pain Orientation   Right    Pain Descriptors / Indicators  Aching    Pain Type  Chronic pain    Pain Onset  More than a month ago    Pain Frequency  Intermittent    Aggravating Factors   bending    Pain Relieving Factors  rest    Effect of Pain on Daily Activities  difficulty perfroming daily activity         [facility] PT Assessment - 01/19/19 0001      Assessment   Medical Diagnosis  right Low back pain with radiculopathy     Referring Provider (PT)  Dr Legrand Como Hilts       AROM   Lumbar Flexion  50% limited     Lumbar - Right Rotation  limited 25%     Lumbar - Left Rotation  limited 25%       Strength   Right Hip Flexion  4+/5    Right Hip ABduction  4+/5    Right Hip ADduction  4+/5                   [facility] Adult PT Treatment/Exercise - 01/19/19 0001      Self-Care   Posture  reviewed  use of a caccyx pad for driving and when she is sitting at home. Reviewed the use of iice to decrease acute inflammation of the area.       Lumbar Exercises: Stretches   Single Knee to Chest Stretch  3 reps;30 seconds    Lower Trunk Rotation Limitations  x10    Piriformis Stretch  3 reps;30 seconds    Piriformis Stretch Limitations  push and pull       Manual Therapy   Manual Therapy  Joint mobilization;Soft tissue mobilization    Joint Mobilization  LAD 5x30 sec hold bilateral     Soft tissue mobilization  trigger point release along bil lumbar parapsinals             PT Education - 01/19/19 1426    Education Details  reviewed HEP and symptom management    Person(s) Educated  Patient    Methods  Explanation;Demonstration;Tactile cues;Verbal cues    Comprehension  Verbalized understanding;Returned demonstration;Verbal cues required;Tactile cues required       PT Short Term Goals - 01/19/19 0942      PT SHORT TERM GOAL #1   Title  Patient will be independent with Mckenzie progression    Baseline  held 2nd to MRI    Time  3    Period  Weeks    Status  Deferred    Target Date   01/15/19      PT SHORT TERM GOAL #2   Title  Patient will report centralized low back pain with no tigling into her right toe    Baseline  numbness still comes and goes.    Time  3    Period  Weeks    Status  Achieved      PT SHORT TERM GOAL #3   Title  Patient will ambualte 300' without an antalgic gait    Baseline  improved gait upon entranceoday but still not putting as much weight on the right side.    Time  3    Period  Weeks    Status  On-going        PT Long Term Goals - 01/19/19 1106      PT LONG TERM GOAL #1   Title  Patient will pick up boxes without pain    Baseline  unable to lift without increasing radicular symptoms    Time  6    Period  Weeks    Status  New    Target Date  03/02/19      PT LONG TERM GOAL #2   Title  Patient will sleep through the night without radicular symptoms    Baseline  leg goes numb at night    Time  6    Period  Weeks    Status  New    Target Date  03/02/19      PT LONG TERM GOAL #3   Title  Patient will stand for 1 hour without increased symptoms in order to perfrom work tasks    Time  6    Period  Weeks    Status  New    Target Date  03/02/19            Plan - 01/19/19 0930    Clinical Impression Statement  Per MRI therapy will change focus. Patient had presented like a lumbar disc but istead has been shown to have a sacral fracture. We will work on symptom mangement, soft tissue mobilization, and decreasing acute iflammation in  the area. Patient was advised to buy acoccyx donut to relive pressure on her sacral/coccyx area. Therapy reviewed light stretching. She was advised to hold on prone press-ups for now. She was advised to follow her symptoms and ice to decrease inflammation around the area. If therapyis helpingdecrease her symptoms we will continue 1-2x a week for 4 weeks. If it is irritating her we will hold and let her heal. She has improved right hip motion and strength. She does coneinue to have radicular symptoms.     Personal Factors and Comorbidities  Age;Sex;Comorbidity 1    Examination-Activity Limitations  Lift;Transfers;Stand    Clinical Decision Making  Low    Rehab Potential  Excellent    PT Frequency  2x / week    PT Duration  4 weeks    PT Treatment/Interventions  ADLs/Self Care Home Management;Cryotherapy;Electrical Stimulation;Moist Heat;Ultrasound;DME Instruction;Gait training;Stair training;Functional mobility training;Therapeutic activities;Therapeutic exercise;Neuromuscular re-education;Patient/family education;Manual techniques;Passive range of motion;Dry needling;Taping;Joint Manipulations;Spinal Manipulations    PT Next Visit Plan  assess response to IFC. extension bias progression,  Consider side lying spinal mobilizations; continue with LAD; soft titssue mobilizations, add core strengthening to hEP,response to DN, functiona lifting/ pushing/ pulling activities    PT Home Exercise Plan  prone, prone  on elbows, tennis ball release, prone press-ups    Consulted and Agree with Plan of Care  Patient       Patient will benefit from skilled therapeutic intervention in order to improve the following deficits and impairments:  Abnormal gait, Increased muscle spasms, Decreased activity tolerance, Decreased strength, Decreased knowledge of precautions, Decreased range of motion, Improper body mechanics, Decreased mobility, Decreased endurance  Visit Diagnosis: Acute right-sided low back pain with right-sided sciatica  PHYSICAL THERAPY DISCHARGE SUMMARY  Visits from Start of Care: 4  Current functional level related to goals / functional outcomes: Continued pain  Remaining deficits: Unknown    Education / Equipment: HEP   Plan: Patient agrees to discharge.  Patient goals were not met. Patient is being discharged due to not returning since the last visit.  ?????       Problem List Patient Active Problem List   Diagnosis Date Noted  . Encounter for sterilization   . HSV-2  (herpes simplex virus 2) infection 03/04/2015  . Current smoker 12/15/2014  . Tobacco smoking affecting pregnancy, antepartum 12/15/2014    Carney Living 01/19/2019, 2:37 PM  [facility] [address] [place], [place], [address] Phone: [phone]   Fax:  [phone]  Name: Lori Gentry MRN: [id number] Date of Birth: 13-Aug-1985

## 2019-01-19 NOTE — Telephone Encounter (Signed)
I advised the patient. She will discuss with her supervisor &/or HR. She will come this afternoon to pick up the new letter.

## 2019-01-19 NOTE — Telephone Encounter (Signed)
Lumbar MRI scan does not show any disc herniations.  There is a stress fracture of the right side of the tailbone which is the most likely cause of her pain.  This will heal without surgery but we need to modify her activities for possibly 6 to 12 weeks to allow bone healing.  It is very important that she quit smoking in order to improve bone strength.  I also recommend taking vitamin D3 at 5000 IU daily; vitamin K2 at 100 mcg daily; and magnesium at 200-400 mg daily.  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  I should see her back for recheck around that time.

## 2019-02-04 ENCOUNTER — Ambulatory Visit: Payer: Medicaid Other | Admitting: Family Medicine

## 2019-08-11 IMAGING — US ULTRASOUND ABDOMEN LIMITED
1 series · 14 of 25 positions shown · non-contrast
Comparison: None.

CLINICAL DATA: RIGHT upper quadrant pain today. History of
gallbladder sludge.

EXAM:
ULTRASOUND ABDOMEN LIMITED RIGHT UPPER QUADRANT

[Series 1: ultrasound abdomen limited · 14 of 56 slices shown]
[im 1/56]
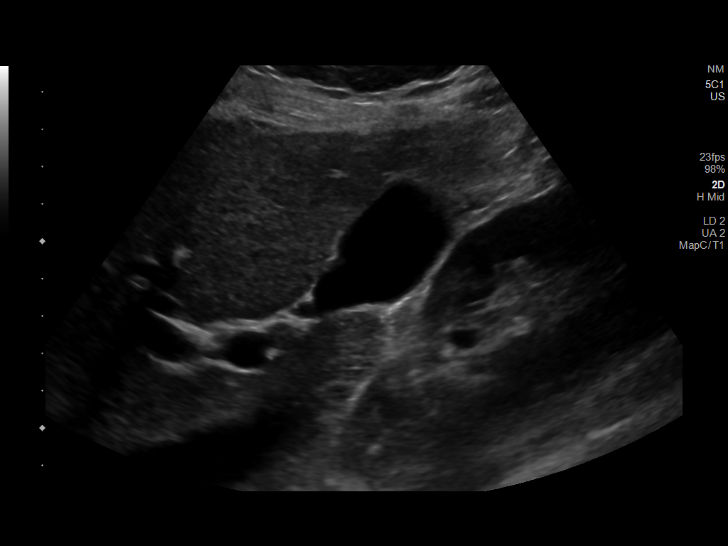
[im 5/56]
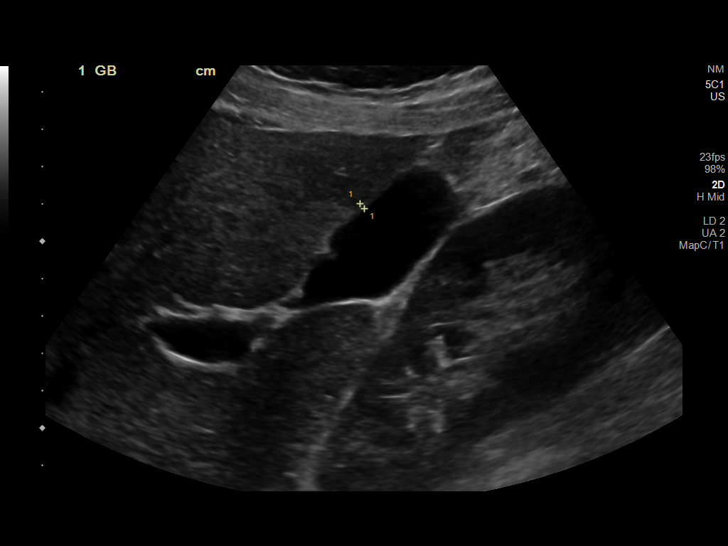
[im 10/56]
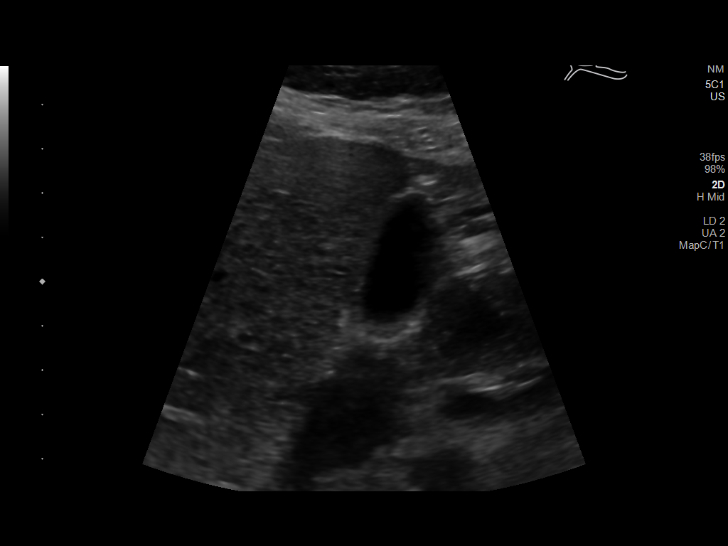
[im 14/56]
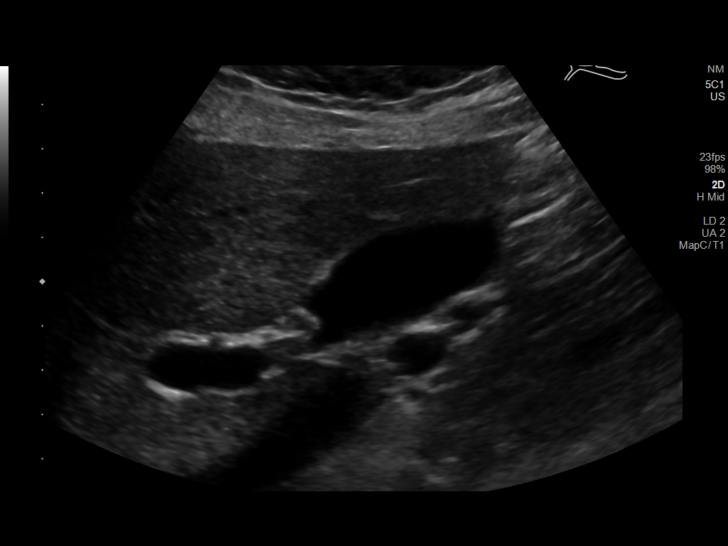
[im 19/56]
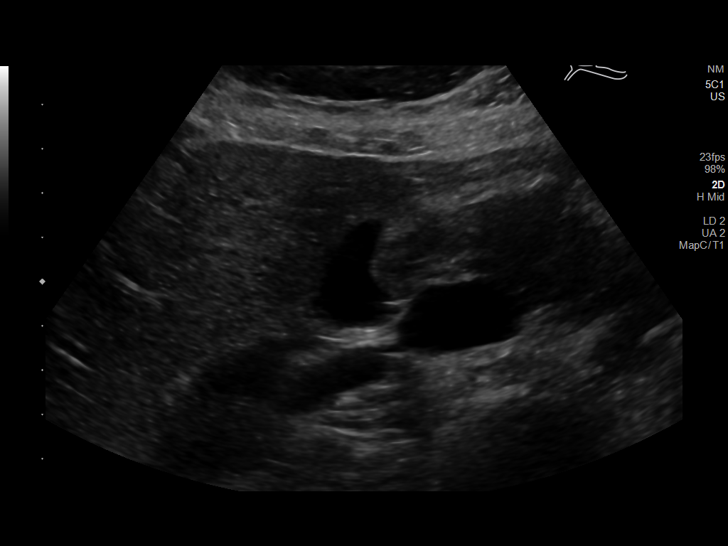
[im 21/56]
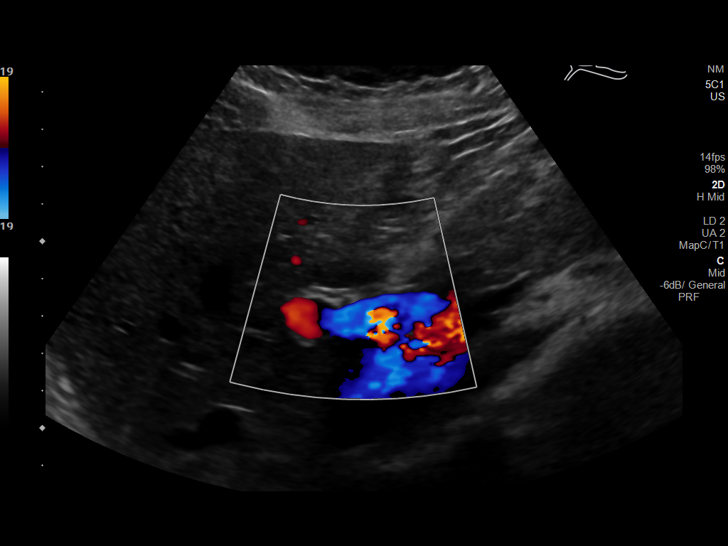
[im 26/56]
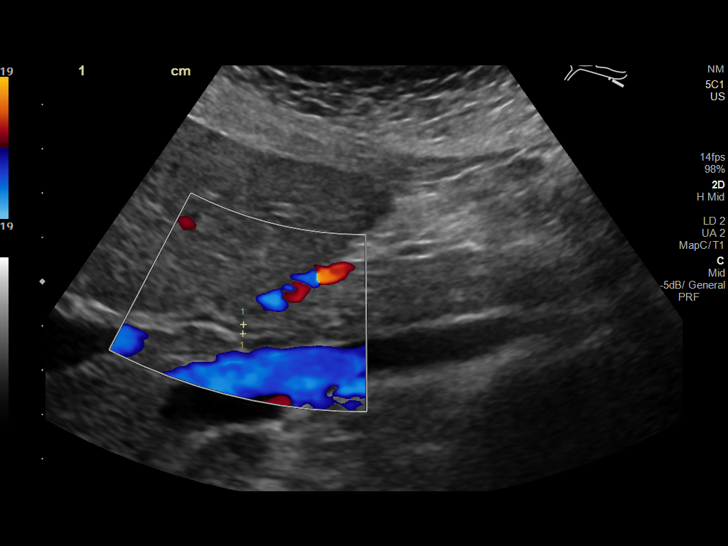
[im 30/56]
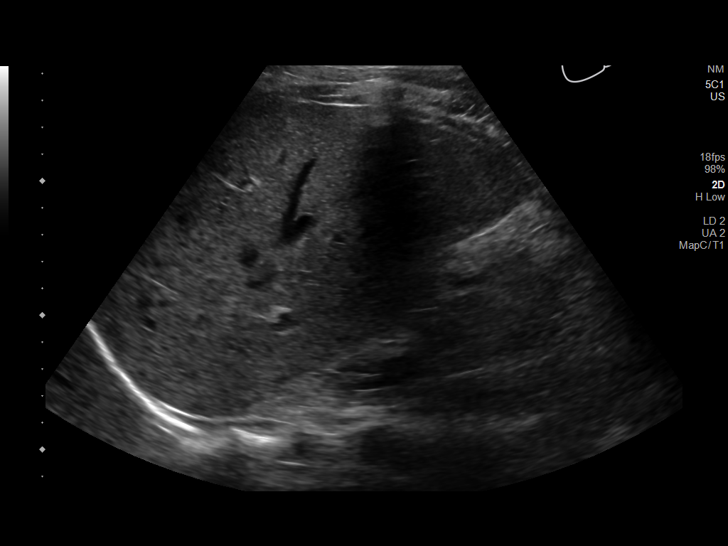
[im 35/56]
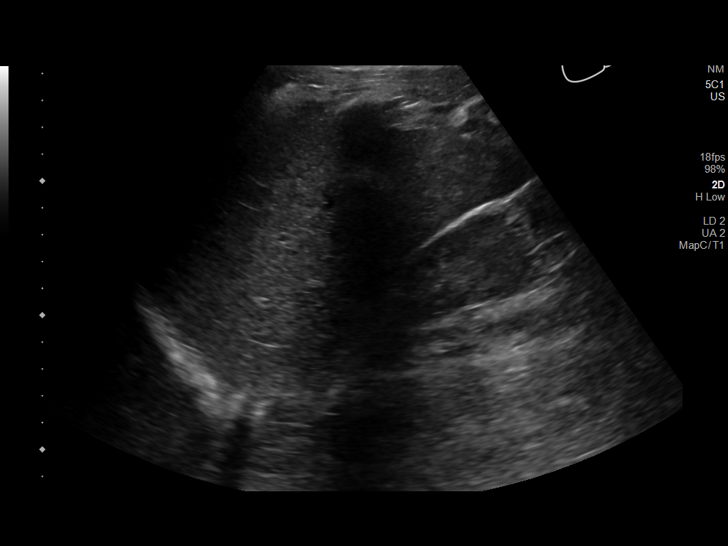
[im 37/56]
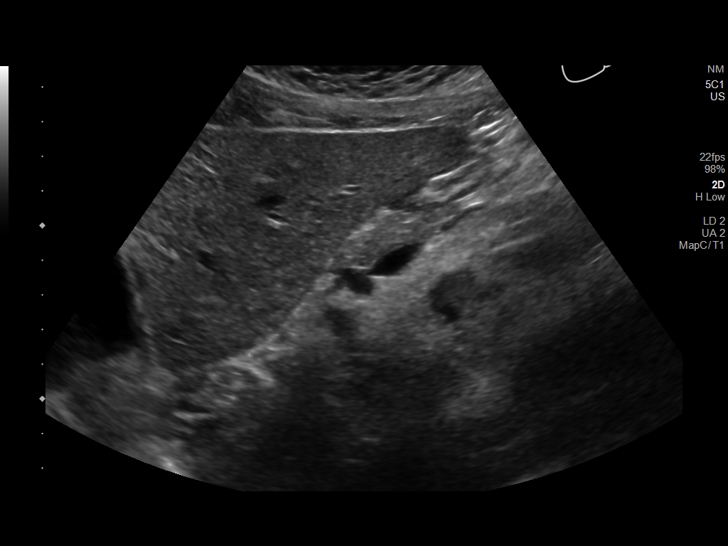
[im 42/56]
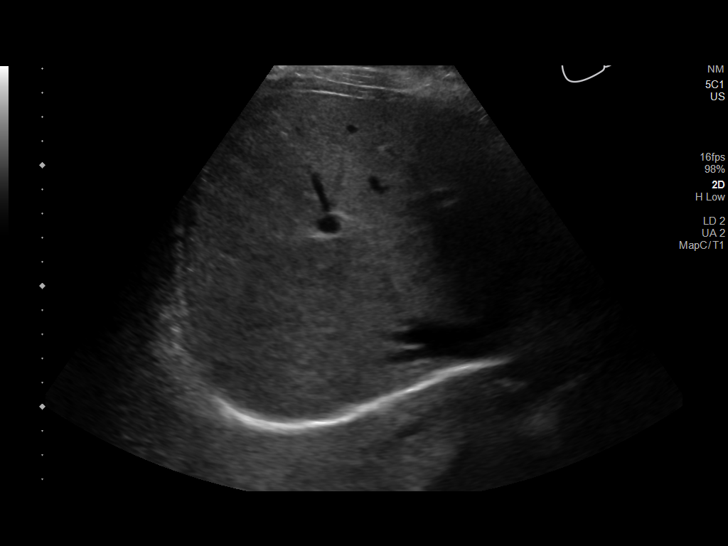
[im 46/56]
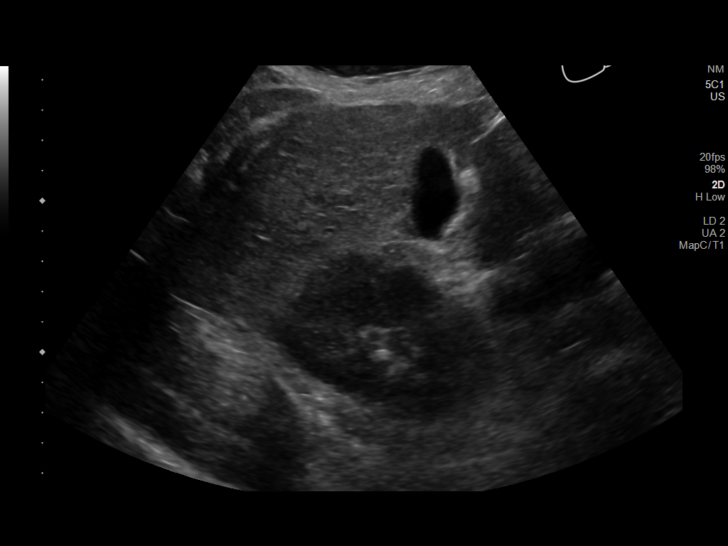
[im 51/56]
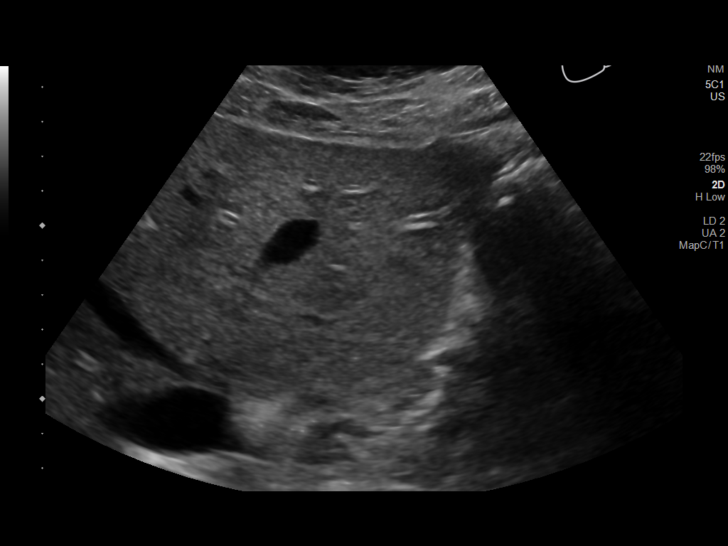
[im 56/56]
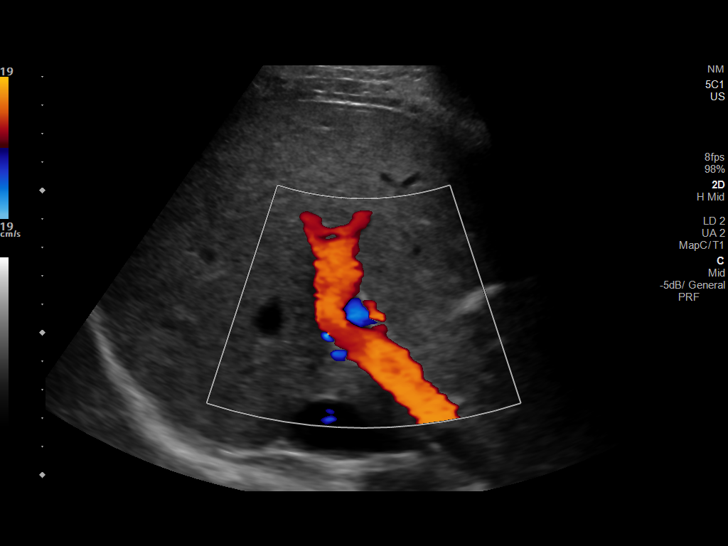

[14 of 25 positions shown; findings below may reference images not displayed]

FINDINGS: Gallbladder:

No gallstones or wall thickening visualized. No sludge is
appreciated within the gallbladder on today's exam. No sonographic
Murphy sign noted by sonographer.

Common bile duct:

Diameter: 2 mm

Liver:

No focal lesion identified. Within normal limits in parenchymal
echogenicity. Portal vein is patent on color Doppler imaging with
normal direction of blood flow towards the liver.
IMPRESSION: Normal RIGHT upper quadrant ultrasound.
